# Patient Record
Sex: Female | Born: 1970 | Race: White | Hispanic: No | Marital: Married | Smoking: Current every day smoker
Health system: Southern US, Community
[De-identification: ages and names within clinical notes are randomized; demographics above are authoritative.]

## PROBLEM LIST (undated history)

## (undated) DIAGNOSIS — F418 Other specified anxiety disorders: Secondary | ICD-10-CM

## (undated) DIAGNOSIS — M51369 Other intervertebral disc degeneration, lumbar region without mention of lumbar back pain or lower extremity pain: Secondary | ICD-10-CM

## (undated) DIAGNOSIS — M5136 Other intervertebral disc degeneration, lumbar region: Secondary | ICD-10-CM

## (undated) DIAGNOSIS — I10 Essential (primary) hypertension: Secondary | ICD-10-CM

## (undated) DIAGNOSIS — M549 Dorsalgia, unspecified: Secondary | ICD-10-CM

## (undated) DIAGNOSIS — O009 Unspecified ectopic pregnancy without intrauterine pregnancy: Secondary | ICD-10-CM

## (undated) DIAGNOSIS — N809 Endometriosis, unspecified: Secondary | ICD-10-CM

## (undated) DIAGNOSIS — J45909 Unspecified asthma, uncomplicated: Secondary | ICD-10-CM

## (undated) DIAGNOSIS — G8929 Other chronic pain: Secondary | ICD-10-CM

## (undated) HISTORY — PX: OTHER SURGICAL HISTORY: SHX169

## (undated) HISTORY — PX: ECTOPIC PREGNANCY SURGERY: SHX613

## (undated) HISTORY — PX: DILATION AND CURETTAGE OF UTERUS: SHX78

---

## 2006-03-20 ENCOUNTER — Observation Stay (HOSPITAL_COMMUNITY): Admission: EM | Admit: 2006-03-20 | Discharge: 2006-03-21 | Payer: Self-pay | Admitting: Emergency Medicine

## 2006-03-21 ENCOUNTER — Inpatient Hospital Stay (HOSPITAL_COMMUNITY): Admission: EM | Admit: 2006-03-21 | Discharge: 2006-03-24 | Payer: Self-pay | Admitting: Psychiatry

## 2006-03-22 ENCOUNTER — Ambulatory Visit: Payer: Self-pay | Admitting: *Deleted

## 2007-04-26 ENCOUNTER — Emergency Department (HOSPITAL_COMMUNITY): Admission: EM | Admit: 2007-04-26 | Discharge: 2007-04-26 | Payer: Self-pay | Admitting: Emergency Medicine

## 2009-05-16 ENCOUNTER — Emergency Department (HOSPITAL_COMMUNITY): Admission: EM | Admit: 2009-05-16 | Discharge: 2009-05-16 | Payer: Self-pay | Admitting: Emergency Medicine

## 2009-09-19 ENCOUNTER — Emergency Department (HOSPITAL_COMMUNITY): Admission: EM | Admit: 2009-09-19 | Discharge: 2009-09-19 | Payer: Self-pay | Admitting: Emergency Medicine

## 2009-12-18 ENCOUNTER — Emergency Department (HOSPITAL_COMMUNITY): Admission: EM | Admit: 2009-12-18 | Discharge: 2009-12-18 | Payer: Self-pay | Admitting: Emergency Medicine

## 2010-04-06 ENCOUNTER — Encounter: Payer: Self-pay | Admitting: Emergency Medicine

## 2010-04-06 ENCOUNTER — Ambulatory Visit (HOSPITAL_COMMUNITY): Admission: EM | Admit: 2010-04-06 | Discharge: 2010-04-06 | Payer: Self-pay | Admitting: Emergency Medicine

## 2010-04-06 ENCOUNTER — Ambulatory Visit: Payer: Self-pay | Admitting: Diagnostic Radiology

## 2010-04-10 ENCOUNTER — Emergency Department (HOSPITAL_BASED_OUTPATIENT_CLINIC_OR_DEPARTMENT_OTHER): Admission: EM | Admit: 2010-04-10 | Discharge: 2010-04-10 | Payer: Self-pay | Admitting: Emergency Medicine

## 2010-07-10 ENCOUNTER — Ambulatory Visit: Payer: Self-pay | Admitting: Diagnostic Radiology

## 2010-07-10 ENCOUNTER — Emergency Department (HOSPITAL_BASED_OUTPATIENT_CLINIC_OR_DEPARTMENT_OTHER): Admission: EM | Admit: 2010-07-10 | Discharge: 2010-07-10 | Payer: Self-pay | Admitting: Emergency Medicine

## 2010-08-03 ENCOUNTER — Emergency Department (HOSPITAL_BASED_OUTPATIENT_CLINIC_OR_DEPARTMENT_OTHER): Admission: EM | Admit: 2010-08-03 | Discharge: 2010-08-03 | Payer: Self-pay | Admitting: Emergency Medicine

## 2010-08-03 ENCOUNTER — Ambulatory Visit: Payer: Self-pay | Admitting: Interventional Radiology

## 2010-10-04 ENCOUNTER — Emergency Department (HOSPITAL_BASED_OUTPATIENT_CLINIC_OR_DEPARTMENT_OTHER): Admission: EM | Admit: 2010-10-04 | Discharge: 2010-10-04 | Payer: Self-pay | Admitting: Emergency Medicine

## 2011-02-03 LAB — URINALYSIS, ROUTINE W REFLEX MICROSCOPIC
Bilirubin Urine: NEGATIVE
Ketones, ur: NEGATIVE mg/dL
Protein, ur: NEGATIVE mg/dL
Specific Gravity, Urine: 1.006 (ref 1.005–1.030)
Urobilinogen, UA: 0.2 mg/dL (ref 0.0–1.0)
pH: 7 (ref 5.0–8.0)

## 2011-02-03 LAB — PREGNANCY, URINE: Preg Test, Ur: NEGATIVE

## 2011-02-03 LAB — DIFFERENTIAL
Basophils Absolute: 0.4 10*3/uL — ABNORMAL HIGH (ref 0.0–0.1)
Basophils Relative: 2 % — ABNORMAL HIGH (ref 0–1)
Eosinophils Absolute: 0.2 10*3/uL (ref 0.0–0.7)
Neutro Abs: 13.4 10*3/uL — ABNORMAL HIGH (ref 1.7–7.7)
Neutrophils Relative %: 79 % — ABNORMAL HIGH (ref 43–77)

## 2011-02-03 LAB — BASIC METABOLIC PANEL
BUN: 11 mg/dL (ref 6–23)
CO2: 26 mEq/L (ref 19–32)
Calcium: 8.8 mg/dL (ref 8.4–10.5)
Chloride: 108 mEq/L (ref 96–112)
Creatinine, Ser: 0.6 mg/dL (ref 0.4–1.2)
Glucose, Bld: 98 mg/dL (ref 70–99)

## 2011-02-03 LAB — CULTURE, ROUTINE-ABSCESS: Culture: NO GROWTH

## 2011-02-03 LAB — URINE MICROSCOPIC-ADD ON

## 2011-02-03 LAB — CBC
MCHC: 34.2 g/dL (ref 30.0–36.0)
MCV: 90.5 fL (ref 78.0–100.0)
RDW: 13 % (ref 11.5–15.5)

## 2011-02-03 LAB — ANAEROBIC CULTURE

## 2011-02-10 ENCOUNTER — Emergency Department (HOSPITAL_BASED_OUTPATIENT_CLINIC_OR_DEPARTMENT_OTHER)
Admission: EM | Admit: 2011-02-10 | Discharge: 2011-02-10 | Disposition: A | Discharge status: Home / Self Care | Payer: Self-pay | Attending: Emergency Medicine | Admitting: Emergency Medicine

## 2011-02-10 DIAGNOSIS — J45909 Unspecified asthma, uncomplicated: Secondary | ICD-10-CM | POA: Insufficient documentation

## 2011-02-10 DIAGNOSIS — J438 Other emphysema: Secondary | ICD-10-CM | POA: Insufficient documentation

## 2011-02-10 DIAGNOSIS — I10 Essential (primary) hypertension: Secondary | ICD-10-CM | POA: Insufficient documentation

## 2011-02-10 DIAGNOSIS — IMO0002 Reserved for concepts with insufficient information to code with codable children: Secondary | ICD-10-CM | POA: Insufficient documentation

## 2011-04-04 NOTE — Discharge Summary (Signed)
NAMEMARGIE, [name] NO.:  [id number]   MEDICAL RECORD NO.:  [id number]          PATIENT TYPE:  IPS   LOCATION:  0506                          FACILITY:  [facility]   PHYSICIAN:  Anselm Jungling, MD  DATE OF BIRTH:  January 28, 1971   DATE OF ADMISSION:  03/21/2006  DATE OF DISCHARGE:  03/24/2006                                 DISCHARGE SUMMARY   IDENTIFYING DATA AND REASON FOR ADMISSION:  The patient is a 40 year old  married white female who was brought to the emergency department at [facility] after displaying changes in mental status.  There was concern that she  had overdosed.  She reported that she had taken only two Klonopin that day,  but apparently had taken more.  She had been having back pain and spasms.  She had not been sleeping well for several days prior.  Her spouse found her  in a state of agitation and lethargy.  The emergency services were called.  The patient was known to have a history of bipolar disorder, diagnosed  approximately 3 years prior.  She had been taking Zyprexa 20 mg daily for 3  years.  She had recently been having more racing thoughts, especially at  night.  Please refer to the admission note for further details pertaining to  the symptoms, circumstances and history that led to her hospitalization.  She was given an initial Axis I diagnosis of bipolar disorder, rule out  mania, rule out mixed state, and marijuana abuse.   MEDICAL AND LABORATORY:  The patient was medically and physically assessed  by the psychiatric nurse practitioner upon admission.  Prior to this, she  had been evaluated at [facility].  Her primary care Deanna [name] is  [facility] in [place].  She came to [place] with problems of  hypertension and COPD, using an inhaler.  Her non-psychotropic medications  upon admission included atenolol that she had not been taking, and Motrin  p.r.n. pain.   She was ordered an albuterol inhaler on a p.r.n. basis  for asthmatic  symptoms.   The patient was seen briefly in the emergency department due to some dental  symptoms.  She had had a tooth pulled a week prior.  She was transported to  the emergency room and returned with a suggestion of oral Nystatin solution,  and a course of Pen-Vee K, with Daypro for pain.  There were no significant  medical issues during this brief inpatient stay.   HOSPITAL COURSE:  The patient was admitted to the adult inpatient  psychiatric service.  She presented as a well-nourished, well-developed  adult female who was pleasant and cooperative throughout her stay.  She  indicated that she could not explain how or why she took too much medication  prior to admission.  She denied any suicide attempt.   She did not appear to be grossly psychotic or delusional in any way.  She  was fully oriented, and cognitive functions appeared to be intact.  Her mood  was neither depressed nor activated, nor manic nor irritable.   The patient  the discussed the possibility of having a family meeting  involving her husband prior to discharge, but this apparently was impossible  to accomplish due to her husband working in another state.  She did  communicate with her husband however and reported that he had indicated a  willingness to keep her medication secure in the future and dispense then to  her appropriately.   The patient was discharged on the fourth hospital day in good spirits.  She  was absent any suicidal ideation or any acute psychiatric symptoms.   AFTERCARE:  The patient was to follow-up at [facility]  on Apr 01, 2006 in [place], [place].   DISCHARGE MEDICATIONS:  Lexapro 20 mg daily, Zyprexa 20 mg daily, Pen-Vee K  500 mg q.i.d. through 03/29/2006, Daypro 600 mg b.i.d., and Nystatin oral  rinse four times daily.   DISCHARGE DIAGNOSES:  AXIS I: Bipolar disorder, currently euthymic.  AXIS II: Deferred.  AXIS III: A dental problem   AXIS IV: Stressors severe.  AXIS V: Global assessment of functioning on discharge 60.           ______________________________  Anselm Jungling, MD  Electronically Signed     SPB/MEDQ  D:  03/25/2006  T:  03/26/2006  Job:  [id number]

## 2011-04-04 NOTE — Discharge Summary (Signed)
Deanna [name], Deanna [name]              ACCOUNT NO.:  [id number]   MEDICAL RECORD NO.:  [id number]          PATIENT TYPE:  INP   LOCATION:  A217                          [facility]ILITY[facility]  [facility]   PHYSICIAN:  Osvaldo Shipper, MD     DATE OF BIRTH:  [birth year]   DATE OF ADMISSION:  03/20/2006  DATE OF DISCHARGE:  05/05/2007LH                                 DISCHARGE SUMMARY   HISTORY:  Unassigned patient.   DISCHARGE DIAGNOSES:  1.  Drug overdose with benzodiazepines, likely unintentional.  2.  Altered mental status, resolved.  3.  History of depression.  4.  History of hypertension.  5.  History of bipolar.   Please note that this is a transfer summary.  The patient is being  transferred to [facility].   BRIEF HOSPITAL COURSE:  Please review the H&P dictated by Dr. Sherle Poe,  also.  This is a 40 year old Caucasian female who was brought in to the  hospital after she was found drowsy and lethargic.  It was first thought  that she took a lot of Klonopin.  The exact dosage of her medication that  she took was unclear.  On initial assessment by the admitting physician, the  patient was very drowsy and unable to give an adequate history.  Initially,  ER physician tried to send the patient directly to [facility].  However, because of her lethargy and mild hypokalemia, they requested that  the patient be observed in the hospital for 24 hours.  Overnight, the  patient became less and less drowsy.  This morning, she was fully awake when  I evaluated her.  She mentioned to me that she did not take her medications  intentionally.  She denied suicidal ideation.  However, the patient seemed  to be mixing up her words and not giving an accurate and reliable history.  Hence, I am not very sure if this patient did or did not do an intentional  OD.  There is also a social issue because the patient has a handicapped  child at home who requires full-time attention.  Because of  these issues, I  discussed with our ACT representative and told him that I would be unable to  clear this patient for discharge without a psychiatrist assessment.  Her  potassium was replaced.  The patient was found to be mildly bronchitic this  morning for which Combivent was given and she had improvement in her  symptoms.  The plan was discussed with the patient who agreed to be  voluntarily sent over to [facility].  Dr. Milford Cage was  the accepting physician.  On the day of discharge, her vital signs  were stable.  She was saturating 96% on room air.  She did have some wheezes  bilaterally which improved with her Combivent inhaler.  Her antihypertensive  medication was also initiated.   MEDICATIONS ON DISCHARGE:  Will be determined at [facility].      Osvaldo Shipper, MD  Electronically Signed     GK/MEDQ  D:  03/21/2006  T:  03/23/2006  Job:  [id number]

## 2011-04-04 NOTE — H&P (Signed)
Deanna [name], Deanna [name]              ACCOUNT NO.:  [id number]   MEDICAL RECORD NO.:  [id number]        PATIENT TYPE:  BIPS   LOCATION:  506                          FACILITY:  [id number]   PHYSICIAN:  Vic Ripper, P.A.-C.DATE OF BIRTH:  1971-05-15   DATE OF ADMISSION:  03/21/2006  DATE OF DISCHARGE:                         PSYCHIATRIC ADMISSION ASSESSMENT   IDENTIFYING INFORMATION:  This is a 40 year old married white female.  She  was brought to the emergency department at [facility] on May 4 after  displaying a change in mental status.  Apparently there was some concern  that she may have overdosed.  The patient is prescribed Klonopin.  She  stated that she had only taken 2 Klonopin that day.  She was having some  back pain and spasms.  She had not been sleeping well for the past 4 days  prior to doing that and she also felt very tired.  Her spouse found her to  be fairly agitated while also being lethargic from the Klonopin.  He called  EMS.  She was admitted for observation.  The patient is known to have  bipolar.  She was diagnosed approximately 3 years ago.  She has been taking  Zyprexa 20 mg at h.s. for 3 years.  She states recently she has been having  racing thoughts at night.  She is not sleeping well.  She is waking up  hourly.  She was transferred from [facility] to our service late yesterday.  She was given trazodone with success last night.  She states she slept the  best she has in months.  She is quite concerned that she can be discharged  today as she has a 26 year old son who has genetic translocation of  chromosomes 4 and 10 and is also mentally retarded.  Her husband has to  leave before the bus comes to pick him up and she would like to be home to  get him back on the bus in the morning.   PAST PSYCHIATRIC HISTORY:  No prior inpatient treatment.  She was diagnosed  as bipolar 3 years ago.  This was after her mother had died.  She could not  sleep.  Her daughter  was crying all the time, and she has been followed by  her primary care physician in [place].   SOCIAL HISTORY:  She is a high school graduate in 1990.  She is married  once.  She is not employed.  As already stated, she has a 87 year old son  who has MR and a genetic translocation of chromosomes 4 and 10.   FAMILY HISTORY:  She denies.   ALCOHOL AND DRUG ABUSE:  She does 1-1/2 to 2 packs of cigarettes per day.  She states that she smokes marijuana 2-3 times a week when a friend comes  over.  Her urine drug screen was positive for the marijuana.   PAST MEDICAL HISTORY:  Primary care Keeleigh Terris is [facility] in [place].  Her medical problems are hypertension and COPD.  She uses  an inhaler.  Medications:  She is currently prescribed Lexapro 20 mg p.o.  daily, Zyprexa 20 mg p.o. daily, Klonopin 1 mg p.o. daily, and Lotrel and  she is prescribed Atenolol.  Actually they said to restart her Lotrel.   ALLERGIES:  No known drug allergies.   POSITIVE PHYSICAL FINDINGS:  PHYSICAL EXAMINATION:  She is overweight.  Her  vital signs on admission to [facility] showed that she weighed  208 pounds.  She had a blood pressure of 148/93.  Her pulse was 100-110, and  respirations were 20.  She was afebrile at 98.7.  The patient states that  she has taken the Zyprexa with no metabolic sequelae for 3 years.  She did  not gain weight.  She had been this weight for awhile.  The remainder of her  physical examination is well documented and on the chart and not repeated at  this time.   MENTAL STATUS EXAM:  She is alert and oriented x3.  She is casually groomed  and dressed, adequately nourished.  Her speech is not pressured.  Her mood  is appropriate to the situation, somewhat depressed and anxious.  Her affect  is congruent.  Thought processes are clear, organized and goal oriented.  She wants to be discharged so that she can resume care of her son.  Judgment  and  insight are intact.  Concentration and memory are intact.  Intelligence  is at least average.  She denies suicidal or homicidal ideation.  She denies  auditory or visual hallucinations.   ADMISSION DIAGNOSES:  AXIS I:  Bipolar disorder, recently racing thoughts  and decreased sleep, marijuana abuse.  AXIS II:  No diagnosis.  AXIS III:  Hypertension, chronic obstructive pulmonary disease, obesity.  AXIS IV:  Severe.  AXIS V:  Global assessment of function is 30-35.   PLAN:  Plan was to admit for further stabilization, to adjust her  medications as indicated and the patient is requesting discharge.  We will  check with Dr. Katrinka Blazing to see if that will be possible or not.      Vic Ripper, P.A.-C.     MD/MEDQ  D:  03/22/2006  T:  03/22/2006  Job:  [id number]

## 2011-04-04 NOTE — H&P (Signed)
NAMEBERNISHA, Deanna [name]              ACCOUNT NO.:  [id number]   MEDICAL RECORD NO.:  [id number]          PATIENT TYPE:  INP   LOCATION:  A217                          FACILITY:  [facility]   PHYSICIAN:  Margaretmary Dys, M.D.DATE OF BIRTH:  [birth year]   DATE OF ADMISSION:  03/20/2006  DATE OF DISCHARGE:  LH                                HISTORY & PHYSICAL   PRIMARY CARE PHYSICIAN:  Unassigned.   ADMISSION DIAGNOSES:  1.  Drug overdose.  2.  Altered mental status.   CHIEF COMPLAINT:  The patient took a few pills of Klonopin tonight.   HISTORY OF PRESENT ILLNESS:  Deanna [name] is a 40 year old female who was  brought into the emergency room with complaints of overdose.  The patient  provided me some history but was fairly drowsy.  She said she was having  some back pain and spasms and she took only two pills of Klonopin that was  prescribed to another friend of hers.  She does not take Klonopin.  She  reports that she has not slept in four days and has felt very tired.  The  patient was found by her spouse to be fairly agitated and also lethargic.  They subsequently called EMS.  The patient categorically tells me that she  was not trying to commit suicide and that she enjoys life.  Prior to today,  she was doing fairly well with no significant past medical history or  complaints.  Evaluation in the emergency room revealed she was still feeling  lethargic.  Urine toxicology was positive for THC and benzodiazepines.  Due  to her being so lethargic and drowsy, the patient will be admitted for  medical clearance prior to transfer if still indicated on evaluation when  she is more lucid.   REVIEW OF SYSTEMS:  The temporary review of systems is otherwise negative  except as mentioned in the history of the present illness.   PAST MEDICAL HISTORY:  1.  Hypertension.  2.  Bipolar disorder.  3.  Depression.   MEDICATIONS:  1.  Lexapro.  2.  Klonopin.  3.  Paxil.  4.  Atenolol.  5.   Motrin p.r.n.   ALLERGIES:  She has no known drug allergies.   FAMILY HISTORY:  Positive for coronary artery disease and hypertension.  No  history of severe depressive psychosis or suicidal attempt.   SOCIAL HISTORY:  The patient is married, lives with husband, has one child.  Smokes 1-1/2 packs a day.  Denies any illicit drug use even though her THC  was positive in her urine.   PHYSICAL EXAMINATION:  GENERAL:  The patient is conscious but lethargic and  fairly drowsy but easily arousable.  VITAL SIGNS:  Blood pressure 148/93, pulse 105, respirations 20, temperature  97.4.  Oxygen saturation 98% on room air.  HEENT:  Normocephalic, atraumatic.  Oral mucosa was moist with no exudates.  NECK:  Supple, no JVD, no lymphadenopathy.  LUNGS:  Clear.  There is good air entry bilaterally.  HEART:  S1, S2 regular.  No S3, S4 gallops or rubs.  ABDOMEN:  Soft,  nontender, bowel sounds positive.  Obese.  EXTREMITIES:  No edema.  No induration or tenderness.  CENTRAL NERVOUS SYSTEM:  The patient was conscious but lethargic.  She had  no focal neurologic deficits.  Pupils were equal and reactive to light.  Sensation was intact.   LABORATORY DATA/DIAGNOSTIC DATA:  Her urine toxicology screen was positive  for benzodiazepines and THC.   White blood cell count was 9.4.  Hemoglobin was 13.9, hematocrit 40.3,  platelet count was 269,000 with no left shift.  Sodium 133, potassium 3.4,  chloride 103, CO2 23, glucose 103, BUN of 10, creatinine 0.5, calcium was  8.4.  Pregnancy test was negative.  Alcohol level was less than 5.   ASSESSMENT AND PLAN:  This is a 40 year old female who presented to the  emergency room with what appears to be unintentional overdose with Klonopin  and subsequent altered mental status due to sedative effect.  Plan is to  admit her at this time to the floor.  We will put her on neurochecks q.2 h.  and we will replete her potassium.  We will have a sitter with her and   review her mental status tomorrow.  The patient was already seen by the ACT  team in the ER, and the patient will be seen again tomorrow morning . We  will hold off on her medications at this time.      Margaretmary Dys, M.D.  Electronically Signed     AM/MEDQ  D:  03/21/2006  T:  03/21/2006  Job:  [id number]

## 2011-05-27 ENCOUNTER — Emergency Department (HOSPITAL_BASED_OUTPATIENT_CLINIC_OR_DEPARTMENT_OTHER)
Admission: EM | Admit: 2011-05-27 | Discharge: 2011-05-27 | Disposition: A | Discharge status: Home / Self Care | Payer: Self-pay | Attending: Emergency Medicine | Admitting: Emergency Medicine

## 2011-05-27 ENCOUNTER — Emergency Department (INDEPENDENT_AMBULATORY_CARE_PROVIDER_SITE_OTHER): Payer: Self-pay

## 2011-05-27 ENCOUNTER — Encounter: Payer: Self-pay | Admitting: *Deleted

## 2011-05-27 DIAGNOSIS — M47817 Spondylosis without myelopathy or radiculopathy, lumbosacral region: Secondary | ICD-10-CM

## 2011-05-27 DIAGNOSIS — F17200 Nicotine dependence, unspecified, uncomplicated: Secondary | ICD-10-CM | POA: Insufficient documentation

## 2011-05-27 DIAGNOSIS — M545 Low back pain: Secondary | ICD-10-CM

## 2011-05-27 DIAGNOSIS — M549 Dorsalgia, unspecified: Secondary | ICD-10-CM

## 2011-05-27 LAB — URINALYSIS, ROUTINE W REFLEX MICROSCOPIC
Bilirubin Urine: NEGATIVE
Glucose, UA: NEGATIVE mg/dL
Hgb urine dipstick: NEGATIVE
Ketones, ur: NEGATIVE mg/dL
Nitrite: NEGATIVE
Protein, ur: NEGATIVE mg/dL
Specific Gravity, Urine: 1.014 (ref 1.005–1.030)
Urobilinogen, UA: 0.2 mg/dL (ref 0.0–1.0)
pH: 6 (ref 5.0–8.0)

## 2011-05-27 LAB — URINE MICROSCOPIC-ADD ON

## 2011-05-27 LAB — PREGNANCY, URINE: Preg Test, Ur: NEGATIVE

## 2011-05-27 MED ORDER — ONDANSETRON 8 MG PO TBDP
8.0000 mg | ORAL_TABLET | Freq: Once | ORAL | Status: AC
  Administered 2011-05-27: 8 mg via ORAL

## 2011-05-27 MED ORDER — CYCLOBENZAPRINE HCL 10 MG PO TABS: 10.0000 mg | ORAL_TABLET | Freq: Two times a day (BID) | ORAL | Status: AC | PRN

## 2011-05-27 MED ORDER — HYDROMORPHONE HCL 2 MG/ML IJ SOLN
2.0000 mg | Freq: Once | INTRAMUSCULAR | Status: AC
  Administered 2011-05-27: 2 mg via INTRAMUSCULAR

## 2011-05-27 MED ORDER — OXYCODONE-ACETAMINOPHEN 5-325 MG PO TABS: 1.0000 | ORAL_TABLET | Freq: Four times a day (QID) | ORAL | Status: AC | PRN

## 2011-05-27 NOTE — ED Provider Notes (Signed)
History     Chief Complaint  Patient presents with  . Back Pain   Patient is a 39 y.o. female presenting with back pain. The history is provided by the patient.  Back Pain  This is a new problem. The current episode started more than 2 days ago. The problem occurs constantly. The problem has been gradually worsening. The pain is associated with no known injury. The pain is present in the lumbar spine (coccyx). The quality of the pain is described as stabbing and shooting. The pain does not radiate. The pain is at a severity of 10/10. The pain is severe. The symptoms are aggravated by certain positions. The pain is the same all the time. Pertinent negatives include no chest pain, no fever, no numbness, no abdominal pain, no bowel incontinence, no bladder incontinence, no dysuria, no leg pain, no paresthesias and no weakness. Associated symptoms comments: Urinary urgency. She has tried NSAIDs for the symptoms. The treatment provided no relief.    Past Medical History  Diagnosis Date  . Degenerative disc disease   . Degenerative disc disease   . Degenerative disc disease     Past Surgical History  Procedure Date  . Ectopic pregnancy surgery     No family history on file.  History  Substance Use Topics  . Smoking status: Current Everyday Smoker -- 1.0 packs/day    Types: Cigarettes  . Smokeless tobacco: Not on file  . Alcohol Use: No    OB History    Grav Para Term Preterm Abortions TAB SAB Ect Mult Living                  Review of Systems  Constitutional: Negative for fever.  Cardiovascular: Negative for chest pain.  Gastrointestinal: Negative for abdominal pain and bowel incontinence.  Genitourinary: Negative for bladder incontinence and dysuria.  Musculoskeletal: Positive for back pain.  Neurological: Negative for weakness, numbness and paresthesias.  All other systems reviewed and are negative.    Physical Exam  BP 145/86  Pulse 88  Temp(Src) 97.9 F (36.6 C)  (Oral)  Resp 20  Ht 5\' 1"  (1.549 m)  Wt 165 lb (74.844 kg)  BMI 31.18 kg/m2  SpO2 99%  LMP 05/06/2011  Physical Exam  Constitutional: She is oriented to person, place, and time. She appears well-developed and well-nourished. She appears distressed.  HENT:  Head: Normocephalic and atraumatic.  Eyes: Pupils are equal, round, and reactive to light.  Cardiovascular: Normal rate, regular rhythm and normal heart sounds.   Pulmonary/Chest: Effort normal and breath sounds normal. She has no wheezes. She has no rales.  Abdominal: Soft. She exhibits no distension. There is no tenderness.  Musculoskeletal:       Lumbar back: She exhibits tenderness and bony tenderness. She exhibits no swelling, no edema and no deformity.       Back:  Neurological: She is alert and oriented to person, place, and time. She has normal strength. No sensory deficit.  Skin: Skin is warm and dry.    ED Course  Procedures  MDM Pt with c/o of back pain in the lower lumbar and sacral area.  Seems to be worse with sitting and pressure in the coccyx.  Pt denies any trauma and no signs of infection.  No signs of pilonidal cyst or abscess and neg bedside U/S. Pt denies urinary or bowel incontinence and pt states maybe mild urgency but denies flank pain or fever.  Normal exam except for sacram and coccyx pain.  Will get plain films to eval and give pain control.  UA contaminated but o/w no signs of overt infection. Plain films neg and on re-eval pt feels much better.  Will d/c home.     Gwyneth Sprout, MD 05/27/11 1920

## 2011-05-27 NOTE — ED Notes (Signed)
Report given to Scott Bennett, RN 

## 2011-05-27 NOTE — ED Notes (Signed)
Pt in with c/o lower back pain radiating to both legs x 2-3 days. Reports repetitive lifting at home. Pain 10/10, reports occasional numbness and tingling in lower extremities. MAEW. Pt able to bear weight.

## 2011-05-27 NOTE — ED Notes (Signed)
Pt. Reports she is having lower back pain and frequent urination.  Pt. Is reporting pain in the bottom of the spine.

## 2012-04-26 ENCOUNTER — Emergency Department (HOSPITAL_COMMUNITY)
Admission: EM | Admit: 2012-04-26 | Discharge: 2012-04-26 | Disposition: A | Discharge status: Home / Self Care | Payer: Self-pay | Attending: Emergency Medicine | Admitting: Emergency Medicine

## 2012-04-26 ENCOUNTER — Encounter (HOSPITAL_COMMUNITY): Payer: Self-pay | Admitting: Emergency Medicine

## 2012-04-26 DIAGNOSIS — F17200 Nicotine dependence, unspecified, uncomplicated: Secondary | ICD-10-CM | POA: Insufficient documentation

## 2012-04-26 DIAGNOSIS — M549 Dorsalgia, unspecified: Secondary | ICD-10-CM

## 2012-04-26 DIAGNOSIS — IMO0002 Reserved for concepts with insufficient information to code with codable children: Secondary | ICD-10-CM | POA: Insufficient documentation

## 2012-04-26 LAB — URINALYSIS, ROUTINE W REFLEX MICROSCOPIC
Glucose, UA: NEGATIVE mg/dL
Ketones, ur: NEGATIVE mg/dL
Leukocytes, UA: NEGATIVE
pH: 7 (ref 5.0–8.0)

## 2012-04-26 LAB — POCT PREGNANCY, URINE: Preg Test, Ur: NEGATIVE

## 2012-04-26 MED ORDER — OXYCODONE-ACETAMINOPHEN 5-325 MG PO TABS: 1.0000 | ORAL_TABLET | ORAL | Status: AC | PRN

## 2012-04-26 MED ORDER — KETOROLAC TROMETHAMINE 60 MG/2ML IM SOLN
60.0000 mg | Freq: Once | INTRAMUSCULAR | Status: AC
  Administered 2012-04-26: 60 mg via INTRAMUSCULAR
  Filled 2012-04-26: qty 2

## 2012-04-26 MED ORDER — IBUPROFEN 800 MG PO TABS: 800.0000 mg | ORAL_TABLET | Freq: Three times a day (TID) | ORAL | Status: AC

## 2012-04-26 MED ORDER — OXYCODONE-ACETAMINOPHEN 5-325 MG PO TABS
2.0000 | ORAL_TABLET | Freq: Once | ORAL | Status: AC
  Administered 2012-04-26: 2 via ORAL
  Filled 2012-04-26: qty 2

## 2012-04-26 NOTE — ED Notes (Signed)
Back pain since yesterday no new injury

## 2012-04-26 NOTE — Discharge Instructions (Signed)

## 2012-04-26 NOTE — ED Provider Notes (Signed)
History    This chart was scribed for Joya Gaskins, MD, MD by Smitty Pluck. The patient was seen in room STRE7 and the patient's care was started at 11:10AM.   CSN: [id number]  Arrival date & time 04/26/12  1013   First MD Initiated Contact with Patient 04/26/12 1108      Chief Complaint  Patient presents with  . Back Pain     Patient is a 41 y.o. female presenting with back pain. The history is provided by the patient.  Back Pain  Pertinent negatives include no fever and no dysuria.   Deanna [name] is a 41 y.o. female who presents to the Emergency Department complaining of moderate lower back pain onset 1 day ago. Pt reports that it radiates to right leg and hips. Pt has hx of back pain for past 10 years. Symptoms have been constant. . Pt denies dysuria. She reports increased urinary frequency. Denies any new weakness in her legs. Denies any new injury. No urinary/fecal incontinence H/o back pain with intermittent episodes, similar to prior  Past Medical History  Diagnosis Date  . Degenerative disc disease   . Degenerative disc disease   . Degenerative disc disease     Past Surgical History  Procedure Date  . Ectopic pregnancy surgery     No family history on file.  History  Substance Use Topics  . Smoking status: Current Everyday Smoker -- 1.0 packs/day    Types: Cigarettes  . Smokeless tobacco: Not on file  . Alcohol Use: No    OB History    Grav Para Term Preterm Abortions TAB SAB Ect Mult Living                  Review of Systems  Constitutional: Negative for fever.  Respiratory: Negative for cough and shortness of breath.   Gastrointestinal: Negative for vomiting and diarrhea.  Genitourinary: Positive for urgency. Negative for dysuria and difficulty urinating.  Musculoskeletal: Positive for back pain.    Allergies  Erythromycin base and Latex  Home Medications   Current Outpatient Rx  Name Route Sig Dispense Refill  . BC HEADACHE POWDER  PO Oral Take 1 packet by mouth daily as needed. For pain    . DIPHENHYDRAMINE HCL 25 MG PO TABS Oral Take 25-75 mg by mouth 2 (two) times daily as needed. For allergies     . IBUPROFEN 200 MG PO TABS Oral Take 800 mg by mouth every 6 (six) hours as needed. As needed       BP 178/111  Pulse 107  Temp 98.8 F (37.1 C)  Resp 20  SpO2 99%  Physical Exam CONSTITUTIONAL: Well developed/well nourished HEAD AND FACE: Normocephalic/atraumatic EYES: EOMI/PERRL ENMT: Mucous membranes moist NECK: supple no meningeal signs SPINE:, lumbar tenderness, no other spinal tenderness, No bruising/crepitance/stepoffs noted to spine CV: S1/S2 noted, no murmurs/rubs/gallops noted LUNGS: Lungs are clear to auscultation bilaterally, no apparent distress ABDOMEN: soft, nontender, no rebound or guarding GU:no cva tenderness NEURO: Awake/alert, equal distal motor: hip flexion/knee flexion/extension, ankle dorsi/plantar flexion, great toe extension intact bilaterally, no apparent sensory deficit in any dermatome.  Equal patellar.  Pt is able to ambulate. EXTREMITIES: pulses normal, full ROM SKIN: warm, color normal PSYCH: no abnormalities of mood noted  ED Course  Procedures  DIAGNOSTIC STUDIES: Oxygen Saturation is 99% on room air, normal by my interpretation.    COORDINATION OF CARE: 11:16AM EDP orders medication: percocet 325 mg, Toradol 60 mg  The patient appears reasonably screened and/or stabilized for discharge and I doubt any other medical condition or other [facility] requiring further screening, evaluation, or treatment in the ED at this time prior to discharge.  MDM  Nursing notes including past medical history, social history and family history reviewed and considered in documentation Previous records reviewed and considered - previous xray results noted All labs/vitals reviewed and considered   I personally performed the services described in this documentation, which was scribed in my  presence. The recorded information has been reviewed and considered.        [id number]  Joya Gaskins, MD 04/26/12 [phone]

## 2012-10-18 ENCOUNTER — Emergency Department (HOSPITAL_BASED_OUTPATIENT_CLINIC_OR_DEPARTMENT_OTHER)
Admission: EM | Admit: 2012-10-18 | Discharge: 2012-10-18 | Disposition: A | Discharge status: Home / Self Care | Payer: Self-pay | Attending: Emergency Medicine | Admitting: Emergency Medicine

## 2012-10-18 ENCOUNTER — Encounter (HOSPITAL_BASED_OUTPATIENT_CLINIC_OR_DEPARTMENT_OTHER): Payer: Self-pay | Admitting: *Deleted

## 2012-10-18 DIAGNOSIS — IMO0002 Reserved for concepts with insufficient information to code with codable children: Secondary | ICD-10-CM | POA: Insufficient documentation

## 2012-10-18 DIAGNOSIS — F17200 Nicotine dependence, unspecified, uncomplicated: Secondary | ICD-10-CM | POA: Insufficient documentation

## 2012-10-18 DIAGNOSIS — K029 Dental caries, unspecified: Secondary | ICD-10-CM | POA: Insufficient documentation

## 2012-10-18 DIAGNOSIS — K0520 Aggressive periodontitis, unspecified: Secondary | ICD-10-CM | POA: Insufficient documentation

## 2012-10-18 DIAGNOSIS — R220 Localized swelling, mass and lump, head: Secondary | ICD-10-CM | POA: Insufficient documentation

## 2012-10-18 DIAGNOSIS — R221 Localized swelling, mass and lump, neck: Secondary | ICD-10-CM | POA: Insufficient documentation

## 2012-10-18 DIAGNOSIS — K089 Disorder of teeth and supporting structures, unspecified: Secondary | ICD-10-CM | POA: Insufficient documentation

## 2012-10-18 DIAGNOSIS — I10 Essential (primary) hypertension: Secondary | ICD-10-CM | POA: Insufficient documentation

## 2012-10-18 DIAGNOSIS — K0889 Other specified disorders of teeth and supporting structures: Secondary | ICD-10-CM

## 2012-10-18 HISTORY — DX: Essential (primary) hypertension: I10

## 2012-10-18 MED ORDER — PENICILLIN V POTASSIUM 500 MG PO TABS: 500.0000 mg | ORAL_TABLET | Freq: Four times a day (QID) | ORAL | Status: AC

## 2012-10-18 MED ORDER — HYDROCODONE-ACETAMINOPHEN 5-325 MG PO TABS: 2.0000 | ORAL_TABLET | ORAL | Status: DC | PRN

## 2012-10-18 NOTE — ED Provider Notes (Signed)
History     CSN: [id number]  Arrival date & time 10/18/12  1400   First MD Initiated Contact with Patient 10/18/12 1412      Chief Complaint  Patient presents with  . Dental Pain    (Consider location/radiation/quality/duration/timing/severity/associated sxs/prior treatment) Patient is a 41 y.o. female presenting with tooth pain. The history is provided by the patient. No language interpreter was used.  Dental PainThe primary symptoms include mouth pain. The symptoms began 2 days ago. The symptoms are worsening. The symptoms are recurrent.  Additional symptoms include: gum swelling and gum tenderness. Medical issues include: periodontal disease.   Pt has a painful upper tooth.  Pt complains of swelling and pain Past Medical History  Diagnosis Date  . Degenerative disc disease   . Degenerative disc disease   . Degenerative disc disease   . Hypertension     Past Surgical History  Procedure Date  . Ectopic pregnancy surgery     History reviewed. No pertinent family history.  History  Substance Use Topics  . Smoking status: Current Every Day Smoker -- 0.5 packs/day    Types: Cigarettes  . Smokeless tobacco: Not on file  . Alcohol Use: No    OB History    Grav Para Term Preterm Abortions TAB SAB Ect Mult Living                  Review of Systems  HENT: Positive for dental problem.   All other systems reviewed and are negative.    Allergies  Erythromycin base and Latex  Home Medications   Current Outpatient Rx  Name  Route  Sig  Dispense  Refill  . BC HEADACHE POWDER PO   Oral   Take 1 packet by mouth daily as needed. For pain         . DIPHENHYDRAMINE HCL 25 MG PO TABS   Oral   Take 25-75 mg by mouth 2 (two) times daily as needed. For allergies          . IBUPROFEN 200 MG PO TABS   Oral   Take 800 mg by mouth every 6 (six) hours as needed. As needed            BP 173/99  Pulse 100  Temp 98.1 F (36.7 C) (Oral)  Resp 18  Ht 5\' 1"  (1.549  m)  Wt 170 lb (77.111 kg)  BMI 32.12 kg/m2  SpO2 98%  LMP 10/17/2012  Physical Exam  Nursing note and vitals reviewed. Constitutional: She appears well-developed and well-nourished.  HENT:  Head: Normocephalic.       Upper single central incisor swollen gum,  Tender to touch  Eyes: Pupils are equal, round, and reactive to light.  Cardiovascular: Normal rate.   Pulmonary/Chest: Effort normal.  Neurological: She is alert.  Skin: Skin is warm.    ED Course  Procedures (including critical care time)  Labs Reviewed - No data to display No results found.   1. Toothache       MDM  Pcn, hydrocodone        Lonia Skinner [place], [place] 10/18/12 1432  Lonia Skinner [place], [place] 10/18/12 1434

## 2012-10-18 NOTE — ED Notes (Signed)
Pt c/o dental pain x 2 days.  

## 2012-10-22 NOTE — ED Provider Notes (Signed)
Medical screening examination/treatment/procedure(s) were performed by non-physician practitioner and as supervising physician I was immediately available for consultation/collaboration.  Ansley Mangiapane M Teiana Hajduk, MD 10/22/12 0201 

## 2013-04-12 ENCOUNTER — Emergency Department (HOSPITAL_BASED_OUTPATIENT_CLINIC_OR_DEPARTMENT_OTHER)
Admission: EM | Admit: 2013-04-12 | Discharge: 2013-04-12 | Disposition: A | Discharge status: Home / Self Care | Payer: Self-pay | Attending: Emergency Medicine | Admitting: Emergency Medicine

## 2013-04-12 ENCOUNTER — Emergency Department (HOSPITAL_BASED_OUTPATIENT_CLINIC_OR_DEPARTMENT_OTHER): Payer: Self-pay

## 2013-04-12 ENCOUNTER — Encounter (HOSPITAL_BASED_OUTPATIENT_CLINIC_OR_DEPARTMENT_OTHER): Payer: Self-pay | Admitting: *Deleted

## 2013-04-12 DIAGNOSIS — Z7982 Long term (current) use of aspirin: Secondary | ICD-10-CM | POA: Insufficient documentation

## 2013-04-12 DIAGNOSIS — I10 Essential (primary) hypertension: Secondary | ICD-10-CM | POA: Insufficient documentation

## 2013-04-12 DIAGNOSIS — Y9389 Activity, other specified: Secondary | ICD-10-CM | POA: Insufficient documentation

## 2013-04-12 DIAGNOSIS — W1809XA Striking against other object with subsequent fall, initial encounter: Secondary | ICD-10-CM | POA: Insufficient documentation

## 2013-04-12 DIAGNOSIS — F17200 Nicotine dependence, unspecified, uncomplicated: Secondary | ICD-10-CM | POA: Insufficient documentation

## 2013-04-12 DIAGNOSIS — J45909 Unspecified asthma, uncomplicated: Secondary | ICD-10-CM | POA: Insufficient documentation

## 2013-04-12 DIAGNOSIS — S5010XA Contusion of unspecified forearm, initial encounter: Secondary | ICD-10-CM | POA: Insufficient documentation

## 2013-04-12 DIAGNOSIS — Z91040 Latex allergy status: Secondary | ICD-10-CM | POA: Insufficient documentation

## 2013-04-12 DIAGNOSIS — Z8739 Personal history of other diseases of the musculoskeletal system and connective tissue: Secondary | ICD-10-CM | POA: Insufficient documentation

## 2013-04-12 DIAGNOSIS — Z79899 Other long term (current) drug therapy: Secondary | ICD-10-CM | POA: Insufficient documentation

## 2013-04-12 DIAGNOSIS — R209 Unspecified disturbances of skin sensation: Secondary | ICD-10-CM | POA: Insufficient documentation

## 2013-04-12 DIAGNOSIS — Z8659 Personal history of other mental and behavioral disorders: Secondary | ICD-10-CM | POA: Insufficient documentation

## 2013-04-12 DIAGNOSIS — Y929 Unspecified place or not applicable: Secondary | ICD-10-CM | POA: Insufficient documentation

## 2013-04-12 DIAGNOSIS — S5011XA Contusion of right forearm, initial encounter: Secondary | ICD-10-CM

## 2013-04-12 HISTORY — DX: Unspecified asthma, uncomplicated: J45.909

## 2013-04-12 HISTORY — DX: Other specified anxiety disorders: F41.8

## 2013-04-12 MED ORDER — HYDROCODONE-ACETAMINOPHEN 5-325 MG PO TABS: 2.0000 | ORAL_TABLET | ORAL | Status: DC | PRN

## 2013-04-12 MED ORDER — NAPROXEN 500 MG PO TABS: 500.0000 mg | ORAL_TABLET | Freq: Two times a day (BID) | ORAL | Status: DC

## 2013-04-12 MED ORDER — OXYCODONE-ACETAMINOPHEN 5-325 MG PO TABS
2.0000 | ORAL_TABLET | Freq: Once | ORAL | Status: AC
  Administered 2013-04-12: 2 via ORAL
  Filled 2013-04-12 (×2): qty 2

## 2013-04-12 MED ORDER — NAPROXEN 250 MG PO TABS
500.0000 mg | ORAL_TABLET | Freq: Once | ORAL | Status: AC
  Administered 2013-04-12: 500 mg via ORAL
  Filled 2013-04-12: qty 2
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
## 2013-06-29 ENCOUNTER — Emergency Department (HOSPITAL_COMMUNITY)
Admission: EM | Admit: 2013-06-29 | Discharge: 2013-06-29 | Disposition: A | Discharge status: Home / Self Care | Payer: Self-pay | Attending: Emergency Medicine | Admitting: Emergency Medicine

## 2013-06-29 ENCOUNTER — Emergency Department (HOSPITAL_COMMUNITY): Payer: Self-pay

## 2013-06-29 ENCOUNTER — Encounter (HOSPITAL_COMMUNITY): Payer: Self-pay | Admitting: Emergency Medicine

## 2013-06-29 DIAGNOSIS — Z8659 Personal history of other mental and behavioral disorders: Secondary | ICD-10-CM | POA: Insufficient documentation

## 2013-06-29 DIAGNOSIS — R110 Nausea: Secondary | ICD-10-CM | POA: Insufficient documentation

## 2013-06-29 DIAGNOSIS — Z3202 Encounter for pregnancy test, result negative: Secondary | ICD-10-CM | POA: Insufficient documentation

## 2013-06-29 DIAGNOSIS — R109 Unspecified abdominal pain: Secondary | ICD-10-CM

## 2013-06-29 DIAGNOSIS — N898 Other specified noninflammatory disorders of vagina: Secondary | ICD-10-CM | POA: Insufficient documentation

## 2013-06-29 DIAGNOSIS — M51379 Other intervertebral disc degeneration, lumbosacral region without mention of lumbar back pain or lower extremity pain: Secondary | ICD-10-CM | POA: Insufficient documentation

## 2013-06-29 DIAGNOSIS — Z7982 Long term (current) use of aspirin: Secondary | ICD-10-CM | POA: Insufficient documentation

## 2013-06-29 DIAGNOSIS — I10 Essential (primary) hypertension: Secondary | ICD-10-CM | POA: Insufficient documentation

## 2013-06-29 DIAGNOSIS — F17200 Nicotine dependence, unspecified, uncomplicated: Secondary | ICD-10-CM | POA: Insufficient documentation

## 2013-06-29 DIAGNOSIS — N760 Acute vaginitis: Secondary | ICD-10-CM

## 2013-06-29 DIAGNOSIS — M5137 Other intervertebral disc degeneration, lumbosacral region: Secondary | ICD-10-CM | POA: Insufficient documentation

## 2013-06-29 DIAGNOSIS — Z91040 Latex allergy status: Secondary | ICD-10-CM | POA: Insufficient documentation

## 2013-06-29 DIAGNOSIS — G8929 Other chronic pain: Secondary | ICD-10-CM | POA: Insufficient documentation

## 2013-06-29 DIAGNOSIS — R197 Diarrhea, unspecified: Secondary | ICD-10-CM | POA: Insufficient documentation

## 2013-06-29 DIAGNOSIS — B9689 Other specified bacterial agents as the cause of diseases classified elsewhere: Secondary | ICD-10-CM

## 2013-06-29 DIAGNOSIS — Z8742 Personal history of other diseases of the female genital tract: Secondary | ICD-10-CM | POA: Insufficient documentation

## 2013-06-29 DIAGNOSIS — J45909 Unspecified asthma, uncomplicated: Secondary | ICD-10-CM | POA: Insufficient documentation

## 2013-06-29 HISTORY — DX: Dorsalgia, unspecified: M54.9

## 2013-06-29 HISTORY — DX: Endometriosis, unspecified: N80.9

## 2013-06-29 HISTORY — DX: Other intervertebral disc degeneration, lumbar region: M51.36

## 2013-06-29 HISTORY — DX: Other chronic pain: G89.29

## 2013-06-29 HISTORY — DX: Unspecified ectopic pregnancy without intrauterine pregnancy: O00.90

## 2013-06-29 HISTORY — DX: Other intervertebral disc degeneration, lumbar region without mention of lumbar back pain or lower extremity pain: M51.369

## 2013-06-29 LAB — COMPREHENSIVE METABOLIC PANEL
Albumin: 3.1 g/dL — ABNORMAL LOW (ref 3.5–5.2)
BUN: 14 mg/dL (ref 6–23)
Calcium: 8.8 mg/dL (ref 8.4–10.5)
Creatinine, Ser: 0.51 mg/dL (ref 0.50–1.10)
Potassium: 3.6 mEq/L (ref 3.5–5.1)
Total Protein: 6.8 g/dL (ref 6.0–8.3)

## 2013-06-29 LAB — CBC WITH DIFFERENTIAL/PLATELET
Basophils Absolute: 0 10*3/uL (ref 0.0–0.1)
Eosinophils Relative: 2 % (ref 0–5)
Lymphocytes Relative: 18 % (ref 12–46)
Neutro Abs: 8.1 10*3/uL — ABNORMAL HIGH (ref 1.7–7.7)
Platelets: 318 10*3/uL (ref 150–400)
RDW: 13.8 % (ref 11.5–15.5)
WBC: 10.9 10*3/uL — ABNORMAL HIGH (ref 4.0–10.5)

## 2013-06-29 LAB — WET PREP, GENITAL
Trich, Wet Prep: NONE SEEN
WBC, Wet Prep HPF POC: NONE SEEN
Yeast Wet Prep HPF POC: NONE SEEN

## 2013-06-29 LAB — LIPASE, BLOOD: Lipase: 35 U/L (ref 11–59)

## 2013-06-29 LAB — URINALYSIS, ROUTINE W REFLEX MICROSCOPIC
Leukocytes, UA: NEGATIVE
Protein, ur: NEGATIVE mg/dL
Urobilinogen, UA: 0.2 mg/dL (ref 0.0–1.0)

## 2013-06-29 MED ORDER — DICYCLOMINE HCL 10 MG/ML IM SOLN
20.0000 mg | Freq: Once | INTRAMUSCULAR | Status: AC
  Administered 2013-06-29: 20 mg via INTRAMUSCULAR
  Filled 2013-06-29: qty 2

## 2013-06-29 MED ORDER — ONDANSETRON HCL 4 MG/2ML IJ SOLN
4.0000 mg | INTRAMUSCULAR | Status: DC | PRN
  Administered 2013-06-29: 4 mg via INTRAVENOUS
  Filled 2013-06-29: qty 2

## 2013-06-29 MED ORDER — MORPHINE SULFATE 4 MG/ML IJ SOLN
4.0000 mg | INTRAMUSCULAR | Status: AC | PRN
  Administered 2013-06-29 (×2): 4 mg via INTRAVENOUS
  Filled 2013-06-29 (×2): qty 1

## 2013-06-29 MED ORDER — SODIUM CHLORIDE 0.9 % IV SOLN
INTRAVENOUS | Status: DC
  Administered 2013-06-29: 1000 mL via INTRAVENOUS

## 2013-06-29 MED ORDER — KETOROLAC TROMETHAMINE 30 MG/ML IJ SOLN
30.0000 mg | Freq: Once | INTRAMUSCULAR | Status: AC
  Administered 2013-06-29: 30 mg via INTRAVENOUS
  Filled 2013-06-29: qty 1

## 2013-06-29 MED ORDER — IOHEXOL 300 MG/ML  SOLN
100.0000 mL | Freq: Once | INTRAMUSCULAR | Status: AC | PRN
  Administered 2013-06-29: 100 mL via INTRAVENOUS

## 2013-06-29 MED ORDER — HYDROCODONE-ACETAMINOPHEN 5-325 MG PO TABS: ORAL_TABLET | ORAL | Status: DC

## 2013-06-29 MED ORDER — IOHEXOL 300 MG/ML  SOLN: 50.0000 mL | Freq: Once | INTRAMUSCULAR | Status: DC | PRN

## 2013-06-29 MED ORDER — METRONIDAZOLE 500 MG PO TABS: 500.0000 mg | ORAL_TABLET | Freq: Two times a day (BID) | ORAL | Status: DC

## 2013-06-29 NOTE — ED Notes (Addendum)
Patient with no complaints at this time. Respirations even and unlabored. Skin warm/dry. Discharge instructions reviewed with patient at this time. Patient given opportunity to voice concerns/ask questions. IV removed per policy and band-aid applied to site. Patient discharged at this time and left Emergency Department via wheelchair.  

## 2013-06-29 NOTE — ED Notes (Signed)
Ct tech in room to get patient. Patient sitting up in bed smoking cigarette. Patient instructed that she could not smoke in or on hospital property. Patient verbalized understanding. Patient's cigarettes and lighter confiscated by security. Dr Clarene Duke made aware.

## 2013-06-29 NOTE — ED Notes (Signed)
Family member out to desk asking for pain medication for said PT.  Spoke with pt, explained was given pain medication 1 hr ago. Pt verbalized understanding. Comfort measures provided.

## 2013-06-29 NOTE — ED Notes (Signed)
Pt's family member out to desk asking a time frame as he needs to leave and daughter wants to have more pain medication.

## 2013-06-29 NOTE — Progress Notes (Signed)
ED/CM noted pt did not have health insurance, and/or PCP. Patient was given the ED [place] uninsured handout with information for the clinics, food pantries, and the handout for insurance sign-up. Patient expressed appreciation for this.      

## 2013-06-29 NOTE — ED Provider Notes (Signed)
CSN: [id number]     Arrival date & time 06/29/13  1015 History     First MD Initiated Contact with Patient 06/29/13 1019     Chief Complaint  Patient presents with  . Abdominal Pain   HPI Pt was seen at 1025.  Per pt, c/o gradual onset and persistence of constant generalized abd "pain" for the past 2 weeks, worse over the past several days.  Has been associated with multiple intermittent episodes of diarrhea and nausea, as well as vaginal discharge.  Describes the abd pain as "cramping."  Denies vomiting, no fevers, no flank/back pain, no rash, no CP/SOB, no black or blood in stools, no hematuria/dysuria.      Past Medical History  Diagnosis Date  . Hypertension   . Asthma   . Depression with anxiety   . Chronic back pain   . DDD (degenerative disc disease), lumbar   . Endometriosis   . Ectopic pregnancy    Past Surgical History  Procedure Laterality Date  . Ectopic pregnancy surgery    . Peri anal cyst    . Ectopic pregnancy surgery    . Dilation and curettage of uterus      History  Substance Use Topics  . Smoking status: Current Every Day Smoker -- 0.50 packs/day    Types: Cigarettes  . Smokeless tobacco: Not on file  . Alcohol Use: No    Review of Systems ROS: Statement: All systems negative except as marked or noted in the HPI; Constitutional: Negative for fever and chills. ; ; Eyes: Negative for eye pain, redness and discharge. ; ; ENMT: Negative for ear pain, hoarseness, nasal congestion, sinus pressure and sore throat. ; ; Cardiovascular: Negative for chest pain, palpitations, diaphoresis, dyspnea and peripheral edema. ; ; Respiratory: Negative for cough, wheezing and stridor. ; ; Gastrointestinal: +abd pain, nausea, diarrhea. Negative for vomiting, blood in stool, hematemesis, jaundice and rectal bleeding. ; ; Genitourinary: Negative for dysuria, flank pain and hematuria. ; ; GYN:  No vaginal bleeding, +vaginal discharge, no vulvar pain.;; Musculoskeletal: Negative  for back pain and neck pain. Negative for swelling and trauma.; ; Skin: Negative for pruritus, rash, abrasions, blisters, bruising and skin lesion.; ; Neuro: Negative for headache, lightheadedness and neck stiffness. Negative for weakness, altered level of consciousness , altered mental status, extremity weakness, paresthesias, involuntary movement, seizure and syncope.     Allergies  Erythromycin base and Latex  Home Medications   Current Outpatient Rx  Name  Route  Sig  Dispense  Refill  . Aspirin-Salicylamide-Caffeine (BC HEADACHE POWDER PO)   Oral   Take 1 packet by mouth daily as needed. For pain         . diphenhydrAMINE (BENADRYL) 25 MG tablet   Oral   Take 25-75 mg by mouth 2 (two) times daily as needed. For allergies          . HYDROcodone-acetaminophen (NORCO/VICODIN) 5-325 MG per tablet   Oral   Take 2 tablets by mouth every 4 (four) hours as needed for pain.   20 tablet   0   . HYDROcodone-acetaminophen (NORCO/VICODIN) 5-325 MG per tablet   Oral   Take 2 tablets by mouth every 4 (four) hours as needed for pain.   10 tablet   0   . ibuprofen (ADVIL,MOTRIN) 200 MG tablet   Oral   Take 800 mg by mouth every 6 (six) hours as needed. As needed          . naproxen (NAPROSYN)  500 MG tablet   Oral   Take 1 tablet (500 mg total) by mouth 2 (two) times daily with a meal.   30 tablet   0    BP 154/84  Pulse 112  Temp(Src) 98.5 F (36.9 C) (Oral)  Resp 20  Ht 5\' 2"  (1.575 m)  Wt 160 lb (72.576 kg)  BMI 29.26 kg/m2  SpO2 96%  LMP 06/15/2013 Physical Exam 1030: Physical examination:  Nursing notes reviewed; Vital signs and O2 SAT reviewed;  Constitutional: Well developed, Well nourished, Well hydrated, Uncomfortable appearing.; Head:  Normocephalic, atraumatic; Eyes: EOMI, PERRL, No scleral icterus; ENMT: Mouth and pharynx normal, Mucous membranes moist; Neck: Supple, Full range of motion, No lymphadenopathy; Cardiovascular: Regular rate and rhythm, No murmur,  rub, or gallop; Respiratory: Breath sounds clear & equal bilaterally, No rales, rhonchi, wheezes.  Speaking full sentences with ease, Normal respiratory effort/excursion; Chest: Nontender, Movement normal; Abdomen: Soft, +diffuse tenderness to palp. No rebound or guarding. Nondistended, Normal bowel sounds; Genitourinary: No CVA tenderness. Pelvic exam performed with permission of pt and female ED tech assist during exam.  External genitalia w/o lesions. Vaginal vault without discharge.  Cervix w/o lesions, not friable, GC/chlam and wet prep obtained and sent to lab.  Bimanual exam w/o CMT or uterine tenderness. +bilat adnexal tenderness.;;; Extremities: Pulses normal, No tenderness, No edema, No calf edema or asymmetry.; Neuro: AA&Ox3, Major CN grossly intact.  Speech clear. Climbs on and off stretcher easily by herself. Gait upright and steady. No gross focal motor or sensory deficits in extremities.; Skin: Color normal, Warm, Dry.   ED Course   Procedures    MDM  MDM Reviewed: previous chart, nursing note and vitals Interpretation: labs, CT scan and ECG    Date: 06/29/2013  Rate: 94  Rhythm: normal sinus rhythm and premature atrial contractions (PAC)  QRS Axis: normal  Intervals: normal  ST/T Wave abnormalities: normal  Conduction Disutrbances:none  Narrative Interpretation:   Old EKG Reviewed: none available.   Results for orders placed during the hospital encounter of 06/29/13  WET PREP, GENITAL      Result Value Range   Yeast Wet Prep HPF POC NONE SEEN  NONE SEEN   Trich, Wet Prep NONE SEEN  NONE SEEN   Clue Cells Wet Prep HPF POC FEW (*) NONE SEEN   WBC, Wet Prep HPF POC NONE SEEN  NONE SEEN  PREGNANCY, URINE      Result Value Range   Preg Test, Ur NEGATIVE  NEGATIVE  URINALYSIS, ROUTINE W REFLEX MICROSCOPIC      Result Value Range   Color, Urine YELLOW  YELLOW   APPearance CLEAR  CLEAR   Specific Gravity, Urine >1.030 (*) 1.005 - 1.030   pH 5.5  5.0 - 8.0   Glucose,  UA NEGATIVE  NEGATIVE mg/dL   Hgb urine dipstick NEGATIVE  NEGATIVE   Bilirubin Urine NEGATIVE  NEGATIVE   Ketones, ur NEGATIVE  NEGATIVE mg/dL   Protein, ur NEGATIVE  NEGATIVE mg/dL   Urobilinogen, UA 0.2  0.0 - 1.0 mg/dL   Nitrite NEGATIVE  NEGATIVE   Leukocytes, UA NEGATIVE  NEGATIVE  CBC WITH DIFFERENTIAL      Result Value Range   WBC 10.9 (*) 4.0 - 10.5 K/uL   RBC 4.24  3.87 - 5.11 MIL/uL   Hemoglobin 12.9  12.0 - 15.0 g/dL   HCT 16.1  09.6 - 04.5 %   MCV 88.7  78.0 - 100.0 fL   MCH 30.4  26.0 -  34.0 pg   MCHC 34.3  30.0 - 36.0 g/dL   RDW 16.1  09.6 - 04.5 %   Platelets 318  150 - 400 K/uL   Neutrophils Relative % 74  43 - 77 %   Neutro Abs 8.1 (*) 1.7 - 7.7 K/uL   Lymphocytes Relative 18  12 - 46 %   Lymphs Abs 1.9  0.7 - 4.0 K/uL   Monocytes Relative 6  3 - 12 %   Monocytes Absolute 0.6  0.1 - 1.0 K/uL   Eosinophils Relative 2  0 - 5 %   Eosinophils Absolute 0.3  0.0 - 0.7 K/uL   Basophils Relative 0  0 - 1 %   Basophils Absolute 0.0  0.0 - 0.1 K/uL  COMPREHENSIVE METABOLIC PANEL      Result Value Range   Sodium 138  135 - 145 mEq/L   Potassium 3.6  3.5 - 5.1 mEq/L   Chloride 105  96 - 112 mEq/L   CO2 23  19 - 32 mEq/L   Glucose, Bld 126 (*) 70 - 99 mg/dL   BUN 14  6 - 23 mg/dL   Creatinine, Ser 4.09  0.50 - 1.10 mg/dL   Calcium 8.8  8.4 - 81.1 mg/dL   Total Protein 6.8  6.0 - 8.3 g/dL   Albumin 3.1 (*) 3.5 - 5.2 g/dL   AST 10  0 - 37 U/L   ALT 13  0 - 35 U/L   Alkaline Phosphatase 99  39 - 117 U/L   Total Bilirubin <0.1 (*) 0.3 - 1.2 mg/dL   GFR calc non Af Amer >90  >90 mL/min   GFR calc Af Amer >90  >90 mL/min  LIPASE, BLOOD      Result Value Range   Lipase 35  11 - 59 U/L  HCG, SERUM, QUALITATIVE      Result Value Range   Preg, Serum NEGATIVE  NEGATIVE   Ct Abdomen Pelvis W Contrast 06/29/2013   *RADIOLOGY REPORT*  Clinical Data: Pain  CT ABDOMEN AND PELVIS WITH CONTRAST  Technique:  Multidetector CT imaging of the abdomen and pelvis was performed  following the standard protocol during bolus administration of intravenous contrast.  Contrast: OMNIPAQUE IOHEXOL 300 MG/ML  SOLN  Comparison: 04/06/2010  Findings: The lung bases are free of acute infiltrate or sizable effusion.  The liver, gallbladder, spleen, adrenal glands and pancreas are all normal in their CT appearance.  The kidneys are well visualized bilaterally and reveal no renal calculi or obstructive changes. Delayed images demonstrate adequate excretion of contrast material.  The appendix is not visualized and may have been surgically removed.  The bladder is well distended without opacified urine. No pelvic mass lesion or side wall abnormality is seen.  The previously noted perianal abscess has resolved in the interval.  No other focal abnormality is seen.  IMPRESSION: No acute abnormality noted.   Original Report Authenticated By: Alcide Clever, M.D.   [place] Art/ven Flow Abd Pelv Doppler 06/29/2013   *RADIOLOGY REPORT*  Clinical Data:  Pelvic pain.  Rule out torsion.  LMP 06/15/2013.  TRANSABDOMINAL AND TRANSVAGINAL ULTRASOUND OF PELVIS DOPPLER ULTRASOUND OF OVARIES  Technique:  Both transabdominal and transvaginal ultrasound examinations of the pelvis were performed. Transabdominal technique was performed for global imaging of the pelvis including uterus, ovaries, adnexal regions, and pelvic cul-de-sac.  It was necessary to proceed with endovaginal exam following the transabdominal exam to visualize the uterus, endometrium.  Color and duplex Doppler  ultrasound was utilized to evaluate blood flow to the ovaries.  Comparison:  CT of the abdomen and pelvis on 06/29/2013  FINDINGS  Uterus:  6.8 x 4.3 x 4.9 cm.  Nabothian cyst incidentally noted.  Endometrium:  Normal in appearance, 0.6 cm in thickness.  Right ovary: Normal in appearance, 3.9 x 3.0 x 2.4 cm.  Left ovary: 2.6 x 1.8 x 1.3 cm.  Normal in appearance.  Pulsed Doppler evaluation demonstrates normal low-resistance arterial and venous  waveforms in both ovaries.  IMPRESSION:  1.Normal exam.  No evidence of pelvic mass or other significant abnormality. 2.No sonographic evidence for ovarian torsion.   Original Report Authenticated By: Norva Pavlov, M.D.     1425:  Pt has tol PO well while in the ED without N/V.  No stooling while in the ED.  Abd benign, VSS. More comfortable after meds and wants to go home now. Will tx for BV.  Dx and testing d/w pt and family.  Questions answered.  Verb understanding, agreeable to d/c home with outpt f/u.         Laray Anger, DO 07/02/13 2204

## 2013-06-29 NOTE — ED Notes (Signed)
Pt c/o lower abd pain x 2 weeks and abnormal vaginal discharge.  Pt says feels like "bladder is burning."   Denies burning with urination.  Reports has had intermittent diarrhea and nausea.  No vomiting.  LBM was yesterday.  LMP was 2 weeks ago.

## 2013-06-29 NOTE — ED Notes (Signed)
Patient reports chest pain with shortness of breath and palpitations. Dr Clarene Duke made aware. EKG done.

## 2013-07-02 ENCOUNTER — Emergency Department (HOSPITAL_BASED_OUTPATIENT_CLINIC_OR_DEPARTMENT_OTHER)
Admission: EM | Admit: 2013-07-02 | Discharge: 2013-07-02 | Disposition: A | Discharge status: Home / Self Care | Payer: Self-pay | Attending: Emergency Medicine | Admitting: Emergency Medicine

## 2013-07-02 ENCOUNTER — Encounter (HOSPITAL_BASED_OUTPATIENT_CLINIC_OR_DEPARTMENT_OTHER): Payer: Self-pay | Admitting: *Deleted

## 2013-07-02 ENCOUNTER — Emergency Department (HOSPITAL_BASED_OUTPATIENT_CLINIC_OR_DEPARTMENT_OTHER): Payer: Self-pay

## 2013-07-02 DIAGNOSIS — Z8739 Personal history of other diseases of the musculoskeletal system and connective tissue: Secondary | ICD-10-CM | POA: Insufficient documentation

## 2013-07-02 DIAGNOSIS — R110 Nausea: Secondary | ICD-10-CM | POA: Insufficient documentation

## 2013-07-02 DIAGNOSIS — I10 Essential (primary) hypertension: Secondary | ICD-10-CM | POA: Insufficient documentation

## 2013-07-02 DIAGNOSIS — Z8742 Personal history of other diseases of the female genital tract: Secondary | ICD-10-CM | POA: Insufficient documentation

## 2013-07-02 DIAGNOSIS — Z79899 Other long term (current) drug therapy: Secondary | ICD-10-CM | POA: Insufficient documentation

## 2013-07-02 DIAGNOSIS — M549 Dorsalgia, unspecified: Secondary | ICD-10-CM | POA: Insufficient documentation

## 2013-07-02 DIAGNOSIS — R109 Unspecified abdominal pain: Secondary | ICD-10-CM | POA: Insufficient documentation

## 2013-07-02 DIAGNOSIS — R197 Diarrhea, unspecified: Secondary | ICD-10-CM | POA: Insufficient documentation

## 2013-07-02 DIAGNOSIS — F17200 Nicotine dependence, unspecified, uncomplicated: Secondary | ICD-10-CM | POA: Insufficient documentation

## 2013-07-02 DIAGNOSIS — J45909 Unspecified asthma, uncomplicated: Secondary | ICD-10-CM | POA: Insufficient documentation

## 2013-07-02 DIAGNOSIS — R630 Anorexia: Secondary | ICD-10-CM | POA: Insufficient documentation

## 2013-07-02 DIAGNOSIS — G8929 Other chronic pain: Secondary | ICD-10-CM | POA: Insufficient documentation

## 2013-07-02 DIAGNOSIS — Z91040 Latex allergy status: Secondary | ICD-10-CM | POA: Insufficient documentation

## 2013-07-02 DIAGNOSIS — F341 Dysthymic disorder: Secondary | ICD-10-CM | POA: Insufficient documentation

## 2013-07-02 LAB — CBC WITH DIFFERENTIAL/PLATELET
Basophils Absolute: 0 10*3/uL (ref 0.0–0.1)
Basophils Relative: 0 % (ref 0–1)
Eosinophils Relative: 2 % (ref 0–5)
HCT: 39.4 % (ref 36.0–46.0)
MCHC: 34.3 g/dL (ref 30.0–36.0)
MCV: 87.8 fL (ref 78.0–100.0)
Monocytes Absolute: 0.5 10*3/uL (ref 0.1–1.0)
Monocytes Relative: 4 % (ref 3–12)
RDW: 13.6 % (ref 11.5–15.5)

## 2013-07-02 LAB — URINALYSIS, ROUTINE W REFLEX MICROSCOPIC
Glucose, UA: NEGATIVE mg/dL
Protein, ur: NEGATIVE mg/dL
Urobilinogen, UA: 0.2 mg/dL (ref 0.0–1.0)

## 2013-07-02 LAB — COMPREHENSIVE METABOLIC PANEL
AST: 24 U/L (ref 0–37)
Albumin: 3.5 g/dL (ref 3.5–5.2)
BUN: 8 mg/dL (ref 6–23)
CO2: 24 mEq/L (ref 19–32)
Calcium: 9.4 mg/dL (ref 8.4–10.5)
Creatinine, Ser: 0.6 mg/dL (ref 0.50–1.10)
GFR calc non Af Amer: >90 mL/min (ref >90)

## 2013-07-02 LAB — LIPASE, BLOOD: Lipase: 13 U/L (ref 11–59)

## 2013-07-02 MED ORDER — HYDROMORPHONE HCL PF 1 MG/ML IJ SOLN
1.0000 mg | Freq: Once | INTRAMUSCULAR | Status: AC
  Administered 2013-07-02: 1 mg via INTRAVENOUS
  Filled 2013-07-02: qty 1

## 2013-07-02 MED ORDER — IOHEXOL 300 MG/ML  SOLN
50.0000 mL | Freq: Once | INTRAMUSCULAR | Status: AC | PRN
  Administered 2013-07-02: 50 mL via ORAL

## 2013-07-02 MED ORDER — ONDANSETRON HCL 4 MG/2ML IJ SOLN
4.0000 mg | Freq: Once | INTRAMUSCULAR | Status: AC
  Administered 2013-07-02: 4 mg via INTRAVENOUS
  Filled 2013-07-02: qty 2

## 2013-07-02 MED ORDER — SODIUM CHLORIDE 0.9 % IV BOLUS (SEPSIS)
1000.0000 mL | Freq: Once | INTRAVENOUS | Status: AC
  Administered 2013-07-02: 1000 mL via INTRAVENOUS

## 2013-07-02 MED ORDER — HYDROCODONE-ACETAMINOPHEN 5-325 MG PO TABS: 1.0000 | ORAL_TABLET | ORAL | Status: DC | PRN

## 2013-07-02 MED ORDER — IOHEXOL 300 MG/ML  SOLN
100.0000 mL | Freq: Once | INTRAMUSCULAR | Status: AC | PRN
  Administered 2013-07-02: 100 mL via INTRAVENOUS

## 2013-07-02 NOTE — ED Provider Notes (Signed)
Medical screening examination/treatment/procedure(s) were conducted as a shared visit with non-physician practitioner(s) and myself.  I personally evaluated the patient during the encounter  Pt with pelvic pain x 2 weeks, neg workup at [facility] several days ago, continued severe pain, repeat CT neg. Advised Gyn followup for continued eval.   Charles B. Bernette Mayers, MD 07/02/13 2049

## 2013-07-02 NOTE — ED Notes (Signed)
PA at bedside for reassessment

## 2013-07-02 NOTE — ED Notes (Signed)
PA at bedside.

## 2013-07-02 NOTE — ED Notes (Signed)
Patient with lower abdominal pain and was seen at [facility] recently for same.  [facility] did [place] and CT scans and patient was d/c and to f/u with GI in 2 days.  Patient states that pain is worse.

## 2013-07-02 NOTE — ED Notes (Signed)
Patient transported to CT ambulatory with tech. 

## 2013-07-02 NOTE — ED Notes (Signed)
Pt ambulatory to restroom

## 2013-07-02 NOTE — ED Provider Notes (Signed)
CSN: [id number]     Arrival date & time 07/02/13  1403 History     First MD Initiated Contact with Patient 07/02/13 1419     Chief Complaint  Patient presents with  . Abdominal Pain   (Consider location/radiation/quality/duration/timing/severity/associated sxs/prior Treatment) HPI Comments: 42 year old female presents to the ER after having been seen at [facility] 2 days ago for the same thing.  She reports a two week history of severe lower abdominal pain with associated nausea without vomiting and diarrhea.  She reports no urinary symptoms, vomiting, fever or chills.  She was supposed to follow up with GI after her work from there but she has not been able to.  She had normal labs, CT and [place].  She denies vaginal discharge, bleeding, or pain with intercourse.  Patient is a 42 y.o. female presenting with abdominal pain. The history is provided by the patient. No language interpreter was used.  Abdominal Pain Pain location:  LLQ and RLQ Pain quality: bloating, sharp and shooting   Pain quality: not aching, not burning, not cramping, no fullness, not heavy, no pressure, no stiffness, not throbbing and not tugging   Pain radiates to:  Does not radiate Pain severity:  Severe Onset quality:  Gradual Duration:  2 weeks Timing:  Constant Progression:  Worsening Chronicity:  New Context: not alcohol use, not diet changes, not eating, not medication withdrawal, not recent illness, not recent sexual activity, not retching and not sick contacts   Relieved by:  Nothing Worsened by:  Nothing tried Ineffective treatments:  None tried Associated symptoms: anorexia, diarrhea and nausea   Associated symptoms: no chest pain, no chills, no constipation, no cough, no dysuria, no fever, no hematuria, no melena, no shortness of breath, no vaginal bleeding, no vaginal discharge and no vomiting   Risk factors: obesity     Past Medical History  Diagnosis Date  . Hypertension   . Asthma   . Depression  with anxiety   . Chronic back pain   . DDD (degenerative disc disease), lumbar   . Endometriosis   . Ectopic pregnancy    Past Surgical History  Procedure Laterality Date  . Ectopic pregnancy surgery    . Peri anal cyst    . Ectopic pregnancy surgery    . Dilation and curettage of uterus     History reviewed. No pertinent family history. History  Substance Use Topics  . Smoking status: Current Every Day Smoker -- 0.50 packs/day    Types: Cigarettes  . Smokeless tobacco: Not on file  . Alcohol Use: No   OB History   Grav Para Term Preterm Abortions TAB SAB Ect Mult Living                 Review of Systems  Constitutional: Negative for fever and chills.  Respiratory: Negative for cough and shortness of breath.   Cardiovascular: Negative for chest pain.  Gastrointestinal: Positive for nausea, abdominal pain, diarrhea and anorexia. Negative for vomiting, constipation and melena.  Genitourinary: Negative for dysuria, hematuria, vaginal bleeding and vaginal discharge.  All other systems reviewed and are negative.    Allergies  Erythromycin base and Latex  Home Medications   Current Outpatient Rx  Name  Route  Sig  Dispense  Refill  . Aspirin-Acetaminophen-Caffeine (GOODYS EXTRA STRENGTH PO)   Oral   Take 1 packet by mouth 2 (two) times daily.         . diphenhydrAMINE (BENADRYL) 25 MG tablet  Oral   Take 50 mg by mouth 2 (two) times daily as needed for allergies. For allergies         . HYDROcodone-acetaminophen (NORCO/VICODIN) 5-325 MG per tablet      1 or 2 tabs PO q6 hours prn pain   20 tablet   0   . HYDROcodone-acetaminophen (NORCO/VICODIN) 5-325 MG per tablet   Oral   Take 1 tablet by mouth every 4 (four) hours as needed for pain.   20 tablet   0   . ibuprofen (ADVIL,MOTRIN) 200 MG tablet   Oral   Take 800 mg by mouth every 6 (six) hours as needed for pain. As needed         . metroNIDAZOLE (FLAGYL) 500 MG tablet   Oral   Take 1 tablet (500  mg total) by mouth 2 (two) times daily.   14 tablet   0    BP 147/91  Pulse 83  Temp(Src) 98.2 F (36.8 C) (Oral)  Resp 16  Ht 5\' 1"  (1.549 m)  Wt 160 lb (72.576 kg)  BMI 30.25 kg/m2  SpO2 99%  LMP 06/15/2013 Physical Exam  Nursing note and vitals reviewed. Constitutional: She is oriented to person, place, and time. She appears well-developed and well-nourished. She appears distressed.  HENT:  Head: Normocephalic and atraumatic.  Right Ear: External ear normal.  Left Ear: External ear normal.  Nose: Nose normal.  Mouth/Throat: Oropharynx is clear and moist. No oropharyngeal exudate.  Eyes: Conjunctivae are normal. Pupils are equal, round, and reactive to light. No scleral icterus.  Neck: Normal range of motion. Neck supple.  Cardiovascular: Normal rate, regular rhythm and normal heart sounds.  Exam reveals no gallop and no friction rub.   No murmur heard. Pulmonary/Chest: Effort normal and breath sounds normal. No respiratory distress. She has no wheezes. She has no rales. She exhibits no tenderness.  Abdominal: Soft. Bowel sounds are normal. She exhibits no distension and no mass. There is tenderness. There is guarding. There is no rigidity and no rebound.    Musculoskeletal: Normal range of motion. She exhibits no edema and no tenderness.  Lymphadenopathy:    She has no cervical adenopathy.  Neurological: She is alert and oriented to person, place, and time. No cranial nerve deficit.  Skin: Skin is warm and dry. No rash noted. No erythema. No pallor.  Psychiatric: She has a normal mood and affect. Her behavior is normal. Judgment and thought content normal.    ED Course   Procedures (including critical care time)  Labs Reviewed  CBC WITH DIFFERENTIAL - Abnormal; Notable for the following:    WBC 11.2 (*)    Neutro Abs 8.5 (*)    All other components within normal limits  COMPREHENSIVE METABOLIC PANEL - Abnormal; Notable for the following:    Glucose, Bld 115 (*)      Total Bilirubin 0.1 (*)    All other components within normal limits  URINALYSIS, ROUTINE W REFLEX MICROSCOPIC - Abnormal; Notable for the following:    APPearance CLOUDY (*)    All other components within normal limits  LIPASE, BLOOD   Ct Abdomen Pelvis W Contrast  07/02/2013   CLINICAL DATA:  Lower abdominal pain. Leukocytosis.  Endometriosis.  EXAM: CT ABDOMEN AND PELVIS WITH CONTRAST  TECHNIQUE: Multidetector CT imaging of the abdomen and pelvis was performed using the standard protocol following bolus administration of intravenous contrast.  CONTRAST:  OMNIPAQUE IOHEXOL 300 MG/ML  SOLN  COMPARISON:  06/29/2013  FINDINGS: The abdominal parenchymal organs are normal in appearance. Gallbladder is unremarkable. No evidence of hydronephrosis.  Uterus and adnexal regions are unremarkable in appearance. No soft tissue masses or lymphadenopathy identified. No evidence of inflammatory process or abnormal fluid collections. No evidence of bowel wall thickening, dilatation, or hernia.  IMPRESSION: Negative. No acute findings or other significant abnormality identified.   Electronically Signed   By: Myles Rosenthal   On: 07/02/2013 17:39   Results for orders placed during the hospital encounter of 07/02/13  CBC WITH DIFFERENTIAL      Result Value Range   WBC 11.2 (*) 4.0 - 10.5 K/uL   RBC 4.49  3.87 - 5.11 MIL/uL   Hemoglobin 13.5  12.0 - 15.0 g/dL   HCT 10.2  72.5 - 36.6 %   MCV 87.8  78.0 - 100.0 fL   MCH 30.1  26.0 - 34.0 pg   MCHC 34.3  30.0 - 36.0 g/dL   RDW 44.0  34.7 - 42.5 %   Platelets 399  150 - 400 K/uL   Neutrophils Relative % 76  43 - 77 %   Neutro Abs 8.5 (*) 1.7 - 7.7 K/uL   Lymphocytes Relative 18  12 - 46 %   Lymphs Abs 2.0  0.7 - 4.0 K/uL   Monocytes Relative 4  3 - 12 %   Monocytes Absolute 0.5  0.1 - 1.0 K/uL   Eosinophils Relative 2  0 - 5 %   Eosinophils Absolute 0.2  0.0 - 0.7 K/uL   Basophils Relative 0  0 - 1 %   Basophils Absolute 0.0  0.0 - 0.1 K/uL   COMPREHENSIVE METABOLIC PANEL      Result Value Range   Sodium 140  135 - 145 mEq/L   Potassium 3.5  3.5 - 5.1 mEq/L   Chloride 105  96 - 112 mEq/L   CO2 24  19 - 32 mEq/L   Glucose, Bld 115 (*) 70 - 99 mg/dL   BUN 8  6 - 23 mg/dL   Creatinine, Ser 9.56  0.50 - 1.10 mg/dL   Calcium 9.4  8.4 - 38.7 mg/dL   Total Protein 6.7  6.0 - 8.3 g/dL   Albumin 3.5  3.5 - 5.2 g/dL   AST 24  0 - 37 U/L   ALT 30  0 - 35 U/L   Alkaline Phosphatase 99  39 - 117 U/L   Total Bilirubin 0.1 (*) 0.3 - 1.2 mg/dL   GFR calc non Af Amer >90  >90 mL/min   GFR calc Af Amer >90  >90 mL/min  LIPASE, BLOOD      Result Value Range   Lipase 13  11 - 59 U/L  URINALYSIS, ROUTINE W REFLEX MICROSCOPIC      Result Value Range   Color, Urine YELLOW  YELLOW   APPearance CLOUDY (*) CLEAR   Specific Gravity, Urine 1.007  1.005 - 1.030   pH 7.0  5.0 - 8.0   Glucose, UA NEGATIVE  NEGATIVE mg/dL   Hgb urine dipstick NEGATIVE  NEGATIVE   Bilirubin Urine NEGATIVE  NEGATIVE   Ketones, ur NEGATIVE  NEGATIVE mg/dL   Protein, ur NEGATIVE  NEGATIVE mg/dL   Urobilinogen, UA 0.2  0.0 - 1.0 mg/dL   Nitrite NEGATIVE  NEGATIVE   Leukocytes, UA NEGATIVE  NEGATIVE   US Transvaginal Non-ob  06/29/2013   *RADIOLOGY REPORT*  Clinical Data:  Pelvic pain.  Rule out torsion.  LMP 06/15/2013.  TRANSABDOMINAL  AND TRANSVAGINAL ULTRASOUND OF PELVIS DOPPLER ULTRASOUND OF OVARIES  Technique:  Both transabdominal and transvaginal ultrasound examinations of the pelvis were performed. Transabdominal technique was performed for global imaging of the pelvis including uterus, ovaries, adnexal regions, and pelvic cul-de-sac.  It was necessary to proceed with endovaginal exam following the transabdominal exam to visualize the uterus, endometrium.  Color and duplex Doppler ultrasound was utilized to evaluate blood flow to the ovaries.  Comparison:  CT of the abdomen and pelvis on 06/29/2013  FINDINGS  Uterus:  6.8 x 4.3 x 4.9 cm.  Nabothian cyst  incidentally noted.  Endometrium:  Normal in appearance, 0.6 cm in thickness.  Right ovary: Normal in appearance, 3.9 x 3.0 x 2.4 cm.  Left ovary: 2.6 x 1.8 x 1.3 cm.  Normal in appearance.  Pulsed Doppler evaluation demonstrates normal low-resistance arterial and venous waveforms in both ovaries.  IMPRESSION:  1.Normal exam.  No evidence of pelvic mass or other significant abnormality. 2.No sonographic evidence for ovarian torsion.   Original Report Authenticated By: Norva Pavlov, M.D.   US Pelvis Complete  06/29/2013   *RADIOLOGY REPORT*  Clinical Data:  Pelvic pain.  Rule out torsion.  LMP 06/15/2013.  TRANSABDOMINAL AND TRANSVAGINAL ULTRASOUND OF PELVIS DOPPLER ULTRASOUND OF OVARIES  Technique:  Both transabdominal and transvaginal ultrasound examinations of the pelvis were performed. Transabdominal technique was performed for global imaging of the pelvis including uterus, ovaries, adnexal regions, and pelvic cul-de-sac.  It was necessary to proceed with endovaginal exam following the transabdominal exam to visualize the uterus, endometrium.  Color and duplex Doppler ultrasound was utilized to evaluate blood flow to the ovaries.  Comparison:  CT of the abdomen and pelvis on 06/29/2013  FINDINGS  Uterus:  6.8 x 4.3 x 4.9 cm.  Nabothian cyst incidentally noted.  Endometrium:  Normal in appearance, 0.6 cm in thickness.  Right ovary: Normal in appearance, 3.9 x 3.0 x 2.4 cm.  Left ovary: 2.6 x 1.8 x 1.3 cm.  Normal in appearance.  Pulsed Doppler evaluation demonstrates normal low-resistance arterial and venous waveforms in both ovaries.  IMPRESSION:  1.Normal exam.  No evidence of pelvic mass or other significant abnormality. 2.No sonographic evidence for ovarian torsion.   Original Report Authenticated By: Norva Pavlov, M.D.   Ct Abdomen Pelvis W Contrast  07/02/2013   CLINICAL DATA:  Lower abdominal pain. Leukocytosis.  Endometriosis.  EXAM: CT ABDOMEN AND PELVIS WITH CONTRAST  TECHNIQUE: Multidetector  CT imaging of the abdomen and pelvis was performed using the standard protocol following bolus administration of intravenous contrast.  CONTRAST:  OMNIPAQUE IOHEXOL 300 MG/ML  SOLN  COMPARISON:  06/29/2013  FINDINGS: The abdominal parenchymal organs are normal in appearance. Gallbladder is unremarkable. No evidence of hydronephrosis.  Uterus and adnexal regions are unremarkable in appearance. No soft tissue masses or lymphadenopathy identified. No evidence of inflammatory process or abnormal fluid collections. No evidence of bowel wall thickening, dilatation, or hernia.  IMPRESSION: Negative. No acute findings or other significant abnormality identified.   Electronically Signed   By: Myles Rosenthal   On: 07/02/2013 17:39   Ct Abdomen Pelvis W Contrast  06/29/2013   *RADIOLOGY REPORT*  Clinical Data: Pain  CT ABDOMEN AND PELVIS WITH CONTRAST  Technique:  Multidetector CT imaging of the abdomen and pelvis was performed following the standard protocol during bolus administration of intravenous contrast.  Contrast: OMNIPAQUE IOHEXOL 300 MG/ML  SOLN  Comparison: 04/06/2010  Findings: The lung bases are free of acute infiltrate or sizable  effusion.  The liver, gallbladder, spleen, adrenal glands and pancreas are all normal in their CT appearance.  The kidneys are well visualized bilaterally and reveal no renal calculi or obstructive changes. Delayed images demonstrate adequate excretion of contrast material.  The appendix is not visualized and may have been surgically removed.  The bladder is well distended without opacified urine. No pelvic mass lesion or side wall abnormality is seen.  The previously noted perianal abscess has resolved in the interval.  No other focal abnormality is seen.  IMPRESSION: No acute abnormality noted.   Original Report Authenticated By: Alcide Clever, M.D.   [place] Art/ven Flow Abd Pelv Doppler  06/29/2013   *RADIOLOGY REPORT*  Clinical Data:  Pelvic pain.  Rule out torsion.  LMP  06/15/2013.  TRANSABDOMINAL AND TRANSVAGINAL ULTRASOUND OF PELVIS DOPPLER ULTRASOUND OF OVARIES  Technique:  Both transabdominal and transvaginal ultrasound examinations of the pelvis were performed. Transabdominal technique was performed for global imaging of the pelvis including uterus, ovaries, adnexal regions, and pelvic cul-de-sac.  It was necessary to proceed with endovaginal exam following the transabdominal exam to visualize the uterus, endometrium.  Color and duplex Doppler ultrasound was utilized to evaluate blood flow to the ovaries.  Comparison:  CT of the abdomen and pelvis on 06/29/2013  FINDINGS  Uterus:  6.8 x 4.3 x 4.9 cm.  Nabothian cyst incidentally noted.  Endometrium:  Normal in appearance, 0.6 cm in thickness.  Right ovary: Normal in appearance, 3.9 x 3.0 x 2.4 cm.  Left ovary: 2.6 x 1.8 x 1.3 cm.  Normal in appearance.  Pulsed Doppler evaluation demonstrates normal low-resistance arterial and venous waveforms in both ovaries.  IMPRESSION:  1.Normal exam.  No evidence of pelvic mass or other significant abnormality. 2.No sonographic evidence for ovarian torsion.   Original Report Authenticated By: Norva Pavlov, M.D.     1. Abdominal pain     MDM  After discussion with Dr. Bernette Mayers, we have decided based on the level of the patient's pain and the fact that the pain has increased over the past two days that though the labs were normal we will repeat the CT scan as at this time the benefits outweight the risks.  6:37 PM Patient with no pathology noted on CT scan as well as labs.  I feel that this is then likely an exacerbation of her endometriosis.  There is no emergency medical condition at this time.  We will refer to [facility] outpatient clinic for further work up.    Izola Price Marisue Humble, [place] 07/02/13 4782

## 2013-08-15 ENCOUNTER — Encounter: Payer: Self-pay | Admitting: Obstetrics & Gynecology

## 2013-10-23 ENCOUNTER — Encounter (HOSPITAL_BASED_OUTPATIENT_CLINIC_OR_DEPARTMENT_OTHER): Payer: Self-pay | Admitting: Emergency Medicine

## 2013-10-23 ENCOUNTER — Emergency Department (HOSPITAL_BASED_OUTPATIENT_CLINIC_OR_DEPARTMENT_OTHER)
Admission: EM | Admit: 2013-10-23 | Discharge: 2013-10-23 | Disposition: A | Discharge status: Home / Self Care | Payer: Self-pay | Attending: Emergency Medicine | Admitting: Emergency Medicine

## 2013-10-23 DIAGNOSIS — J45909 Unspecified asthma, uncomplicated: Secondary | ICD-10-CM | POA: Insufficient documentation

## 2013-10-23 DIAGNOSIS — Z8659 Personal history of other mental and behavioral disorders: Secondary | ICD-10-CM | POA: Insufficient documentation

## 2013-10-23 DIAGNOSIS — I10 Essential (primary) hypertension: Secondary | ICD-10-CM | POA: Insufficient documentation

## 2013-10-23 DIAGNOSIS — X503XXA Overexertion from repetitive movements, initial encounter: Secondary | ICD-10-CM | POA: Insufficient documentation

## 2013-10-23 DIAGNOSIS — Z91040 Latex allergy status: Secondary | ICD-10-CM | POA: Insufficient documentation

## 2013-10-23 DIAGNOSIS — G8929 Other chronic pain: Secondary | ICD-10-CM | POA: Insufficient documentation

## 2013-10-23 DIAGNOSIS — Z79899 Other long term (current) drug therapy: Secondary | ICD-10-CM | POA: Insufficient documentation

## 2013-10-23 DIAGNOSIS — Z8742 Personal history of other diseases of the female genital tract: Secondary | ICD-10-CM | POA: Insufficient documentation

## 2013-10-23 DIAGNOSIS — M62830 Muscle spasm of back: Secondary | ICD-10-CM

## 2013-10-23 DIAGNOSIS — F17200 Nicotine dependence, unspecified, uncomplicated: Secondary | ICD-10-CM | POA: Insufficient documentation

## 2013-10-23 DIAGNOSIS — S239XXA Sprain of unspecified parts of thorax, initial encounter: Secondary | ICD-10-CM | POA: Insufficient documentation

## 2013-10-23 DIAGNOSIS — M5380 Other specified dorsopathies, site unspecified: Secondary | ICD-10-CM | POA: Insufficient documentation

## 2013-10-23 DIAGNOSIS — Y9389 Activity, other specified: Secondary | ICD-10-CM | POA: Insufficient documentation

## 2013-10-23 DIAGNOSIS — Y929 Unspecified place or not applicable: Secondary | ICD-10-CM | POA: Insufficient documentation

## 2013-10-23 MED ORDER — MELOXICAM 7.5 MG PO TABS: 7.5000 mg | ORAL_TABLET | Freq: Every day | ORAL | Status: DC

## 2013-10-23 MED ORDER — CYCLOBENZAPRINE HCL 10 MG PO TABS
10.0000 mg | ORAL_TABLET | Freq: Once | ORAL | Status: AC
  Administered 2013-10-23: 10 mg via ORAL
  Filled 2013-10-23: qty 1

## 2013-10-23 MED ORDER — OXYCODONE-ACETAMINOPHEN 5-325 MG PO TABS
1.0000 | ORAL_TABLET | Freq: Once | ORAL | Status: AC
  Administered 2013-10-23: 1 via ORAL
  Filled 2013-10-23: qty 1

## 2013-10-23 MED ORDER — DIAZEPAM 5 MG PO TABS
5.0000 mg | ORAL_TABLET | Freq: Once | ORAL | Status: AC
  Administered 2013-10-23: 5 mg via ORAL
  Filled 2013-10-23: qty 1

## 2013-10-23 MED ORDER — ONDANSETRON 4 MG PO TBDP
4.0000 mg | ORAL_TABLET | Freq: Once | ORAL | Status: AC
  Administered 2013-10-23: 4 mg via ORAL
  Filled 2013-10-23: qty 1

## 2013-10-23 MED ORDER — PREDNISONE 50 MG PO TABS
60.0000 mg | ORAL_TABLET | Freq: Once | ORAL | Status: AC
  Administered 2013-10-23: 60 mg via ORAL
  Filled 2013-10-23 (×2): qty 1

## 2013-10-23 MED ORDER — DIAZEPAM 5 MG PO TABS: 5.0000 mg | ORAL_TABLET | Freq: Two times a day (BID) | ORAL | Status: DC

## 2013-10-23 NOTE — ED Provider Notes (Signed)
CSN: [id number]     Arrival date & time 10/23/13  1209 History   First MD Initiated Contact with Patient 10/23/13 1311     Chief Complaint  Patient presents with  . Back Pain   (Consider location/radiation/quality/duration/timing/severity/associated sxs/prior Treatment) Patient is a 42 y.o. female presenting with back pain.  Back Pain Location:  Thoracic spine Pain severity:  Severe Pain is:  Same all the time Onset quality:  Gradual Duration:  2 days Timing:  Constant Progression:  Worsening Context: not recent injury   Relieved by:  Nothing Worsened by:  Coughing, deep breathing, movement, twisting and ambulation Ineffective treatments:  Ibuprofen Associated symptoms: no bladder incontinence, no bowel incontinence, no chest pain, no dysuria, no fever, no headaches, no leg pain, no numbness, no paresthesias and no tingling    Deanna [name] is a 42 y.o. female who presents to the ED with back pain that started 2 days ago. The pain has increased and is now severe. She has a history of chronic back pain. 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 Taking ibuprofen without relief. She describes the pain as severe and and in the thoracic area bilateral. The pain radiates to both sides but does not radiate down her legs.   Past Medical History  Diagnosis Date  . Hypertension   . Asthma   . Depression with anxiety   . Chronic back pain   . DDD (degenerative disc disease), lumbar   . Endometriosis   . Ectopic pregnancy    Past Surgical History  Procedure Laterality Date  . Ectopic pregnancy surgery    . Peri anal cyst    . Ectopic pregnancy surgery    . Dilation and curettage of uterus     No family history on file. History  Substance Use Topics  . Smoking status: Current Every Day Smoker -- 0.50 packs/day    Types: Cigarettes  . Smokeless tobacco: Not on file  . Alcohol Use: No   OB History   Grav Para Term Preterm  Abortions TAB SAB Ect Mult Living                 Review of Systems  Constitutional: Negative for fever and chills.  HENT: Negative.   Eyes: Negative for visual disturbance.  Respiratory: Negative for cough and wheezing.   Cardiovascular: Negative for chest pain.  Gastrointestinal: Negative for nausea, vomiting and bowel incontinence.  Genitourinary: Negative for bladder incontinence, dysuria, urgency and frequency.  Musculoskeletal: Positive for back pain. Negative for neck pain.  Skin: Negative for rash.  Neurological: Negative for tingling, numbness, headaches and paresthesias.  Psychiatric/Behavioral: Negative for confusion. The patient is not nervous/anxious.     Allergies  Erythromycin base and Latex  Home Medications   Current Outpatient Rx  Name  Route  Sig  Dispense  Refill  . Aspirin-Acetaminophen-Caffeine (GOODYS EXTRA STRENGTH PO)   Oral   Take 1 packet by mouth 2 (two) times daily.         Marland Kitchen ibuprofen (ADVIL,MOTRIN) 200 MG tablet   Oral   Take 800 mg by mouth every 6 (six) hours as needed for pain. As needed          BP 204/100  Pulse 90  Temp(Src) 98.8 F (37.1 C) (Oral)  Resp 18  SpO2 100% Physical Exam  Nursing note and vitals reviewed. Constitutional: She is oriented to person, place, and time. She appears well-developed and well-nourished.  HENT:  Head: Normocephalic and atraumatic.  Eyes: Conjunctivae and EOM are normal.  Neck: Normal range of motion. Neck supple.  Cardiovascular: Normal rate, regular rhythm and normal heart sounds.   Pulmonary/Chest: Effort normal and breath sounds normal.  Abdominal: Soft. Bowel sounds are normal. There is no tenderness.  Musculoskeletal:       Thoracic back: She exhibits decreased range of motion, tenderness and spasm. She exhibits no deformity and normal pulse.       Back:  No tenderness over the spinal cord. Pedal pulses equal, adequate circulation, good touch sensation.  Neurological: She is alert  and oriented to person, place, and time. She has normal strength and normal reflexes. No cranial nerve deficit or sensory deficit. Gait normal.  Skin: Skin is warm and dry.  Psychiatric: She has a normal mood and affect. Her behavior is normal.    ED Course  Procedures  Patient given muscle relaxants and pain medication and is feeling much better.  BP 175/89  Pulse 90  Temp(Src) 98.8 F (37.1 C) (Oral)  Resp 16  SpO2 99%  MDM  42 y.o. female with thoracic strain and spasm. Will continue medication for spasm and NSAIDS. She will follow up with her PCP. Stable for discharge without any immediate complications. She remains neurovascularly intact.  Discussed with the patient clinical findings and plan of care. All questioned fully answered. She will return if any problems arise.    Medication List    TAKE these medications       diazepam 5 MG tablet  Commonly known as:  VALIUM  Take 1 tablet (5 mg total) by mouth 2 (two) times daily.     meloxicam 7.5 MG tablet  Commonly known as:  MOBIC  Take 1 tablet (7.5 mg total) by mouth daily.      ASK your doctor about these medications       GOODYS EXTRA STRENGTH PO  Take 1 packet by mouth 2 (two) times daily.     ibuprofen 200 MG tablet  Commonly known as:  ADVIL,MOTRIN  Take 800 mg by mouth every 6 (six) hours as needed for pain. As needed           [facility], NP 10/24/13 1530

## 2013-10-23 NOTE — ED Notes (Signed)
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 Taking ibuprofen with minimal to no relief

## 2013-10-25 NOTE — ED Provider Notes (Signed)
Medical screening examination/treatment/procedure(s) were performed by non-physician practitioner and as supervising physician I was immediately available for consultation/collaboration.     Sachiko Methot, MD 10/25/13 1541 

## 2014-08-25 ENCOUNTER — Encounter (HOSPITAL_BASED_OUTPATIENT_CLINIC_OR_DEPARTMENT_OTHER): Payer: Self-pay | Admitting: Emergency Medicine

## 2014-08-25 ENCOUNTER — Emergency Department (HOSPITAL_BASED_OUTPATIENT_CLINIC_OR_DEPARTMENT_OTHER): Payer: Self-pay

## 2014-08-25 ENCOUNTER — Emergency Department (HOSPITAL_BASED_OUTPATIENT_CLINIC_OR_DEPARTMENT_OTHER)
Admission: EM | Admit: 2014-08-25 | Discharge: 2014-08-25 | Disposition: A | Discharge status: Home / Self Care | Payer: Self-pay | Attending: Emergency Medicine | Admitting: Emergency Medicine

## 2014-08-25 DIAGNOSIS — I10 Essential (primary) hypertension: Secondary | ICD-10-CM | POA: Insufficient documentation

## 2014-08-25 DIAGNOSIS — S0081XA Abrasion of other part of head, initial encounter: Secondary | ICD-10-CM | POA: Insufficient documentation

## 2014-08-25 DIAGNOSIS — W19XXXA Unspecified fall, initial encounter: Secondary | ICD-10-CM

## 2014-08-25 DIAGNOSIS — J45909 Unspecified asthma, uncomplicated: Secondary | ICD-10-CM | POA: Insufficient documentation

## 2014-08-25 DIAGNOSIS — W1839XA Other fall on same level, initial encounter: Secondary | ICD-10-CM | POA: Insufficient documentation

## 2014-08-25 DIAGNOSIS — F341 Dysthymic disorder: Secondary | ICD-10-CM | POA: Insufficient documentation

## 2014-08-25 DIAGNOSIS — Y9389 Activity, other specified: Secondary | ICD-10-CM | POA: Insufficient documentation

## 2014-08-25 DIAGNOSIS — G8929 Other chronic pain: Secondary | ICD-10-CM | POA: Insufficient documentation

## 2014-08-25 DIAGNOSIS — Z79899 Other long term (current) drug therapy: Secondary | ICD-10-CM | POA: Insufficient documentation

## 2014-08-25 DIAGNOSIS — S61217A Laceration without foreign body of left little finger without damage to nail, initial encounter: Secondary | ICD-10-CM | POA: Insufficient documentation

## 2014-08-25 DIAGNOSIS — Z23 Encounter for immunization: Secondary | ICD-10-CM | POA: Insufficient documentation

## 2014-08-25 DIAGNOSIS — Z791 Long term (current) use of non-steroidal anti-inflammatories (NSAID): Secondary | ICD-10-CM | POA: Insufficient documentation

## 2014-08-25 DIAGNOSIS — Z8742 Personal history of other diseases of the female genital tract: Secondary | ICD-10-CM | POA: Insufficient documentation

## 2014-08-25 DIAGNOSIS — Z3202 Encounter for pregnancy test, result negative: Secondary | ICD-10-CM | POA: Insufficient documentation

## 2014-08-25 DIAGNOSIS — S61219A Laceration without foreign body of unspecified finger without damage to nail, initial encounter: Secondary | ICD-10-CM

## 2014-08-25 DIAGNOSIS — Z720 Tobacco use: Secondary | ICD-10-CM | POA: Insufficient documentation

## 2014-08-25 DIAGNOSIS — Y93E2 Activity, laundry: Secondary | ICD-10-CM | POA: Insufficient documentation

## 2014-08-25 DIAGNOSIS — M5136 Other intervertebral disc degeneration, lumbar region: Secondary | ICD-10-CM | POA: Insufficient documentation

## 2014-08-25 LAB — CBC WITH DIFFERENTIAL/PLATELET
Basophils Absolute: 0 10*3/uL (ref 0.0–0.1)
Basophils Relative: 0 % (ref 0–1)
Eosinophils Absolute: 0.3 10*3/uL (ref 0.0–0.7)
Eosinophils Relative: 3 % (ref 0–5)
HEMATOCRIT: 37.3 % (ref 36.0–46.0)
HEMOGLOBIN: 12.3 g/dL (ref 12.0–15.0)
LYMPHS PCT: 27 % (ref 12–46)
Lymphs Abs: 2.6 10*3/uL (ref 0.7–4.0)
MCH: 28.7 pg (ref 26.0–34.0)
MCHC: 33 g/dL (ref 30.0–36.0)
MCV: 86.9 fL (ref 78.0–100.0)
MONO ABS: 0.6 10*3/uL (ref 0.1–1.0)
MONOS PCT: 6 % (ref 3–12)
NEUTROS ABS: 6 10*3/uL (ref 1.7–7.7)
Neutrophils Relative %: 64 % (ref 43–77)
Platelets: 359 10*3/uL (ref 150–400)
RBC: 4.29 MIL/uL (ref 3.87–5.11)
RDW: 14.5 % (ref 11.5–15.5)
WBC: 9.6 10*3/uL (ref 4.0–10.5)

## 2014-08-25 LAB — URINALYSIS, ROUTINE W REFLEX MICROSCOPIC
Bilirubin Urine: NEGATIVE
Glucose, UA: NEGATIVE mg/dL
Hgb urine dipstick: NEGATIVE
Ketones, ur: 15 mg/dL — AB
LEUKOCYTES UA: NEGATIVE
NITRITE: NEGATIVE
PROTEIN: NEGATIVE mg/dL
SPECIFIC GRAVITY, URINE: 1.03 (ref 1.005–1.030)
UROBILINOGEN UA: 0.2 mg/dL (ref 0.0–1.0)
pH: 6 (ref 5.0–8.0)

## 2014-08-25 LAB — COMPREHENSIVE METABOLIC PANEL
ALT: 10 U/L (ref 0–35)
ANION GAP: 16 — AB (ref 5–15)
AST: 12 U/L (ref 0–37)
Albumin: 3.3 g/dL — ABNORMAL LOW (ref 3.5–5.2)
Alkaline Phosphatase: 96 U/L (ref 39–117)
BILIRUBIN TOTAL: 0.3 mg/dL (ref 0.3–1.2)
BUN: 10 mg/dL (ref 6–23)
CHLORIDE: 102 meq/L (ref 96–112)
CO2: 22 mEq/L (ref 19–32)
CREATININE: 0.5 mg/dL (ref 0.50–1.10)
Calcium: 8.8 mg/dL (ref 8.4–10.5)
GFR calc Af Amer: >90 mL/min (ref >90)
GFR calc non Af Amer: >90 mL/min (ref >90)
Glucose, Bld: 97 mg/dL (ref 70–99)
POTASSIUM: 3.7 meq/L (ref 3.7–5.3)
Sodium: 140 mEq/L (ref 137–147)
Total Protein: 7.1 g/dL (ref 6.0–8.3)

## 2014-08-25 LAB — RAPID URINE DRUG SCREEN, HOSP PERFORMED
AMPHETAMINES: NOT DETECTED
Barbiturates: NOT DETECTED
Benzodiazepines: POSITIVE — AB
Cocaine: NOT DETECTED
Opiates: NOT DETECTED
TETRAHYDROCANNABINOL: POSITIVE — AB

## 2014-08-25 LAB — PREGNANCY, URINE: PREG TEST UR: NEGATIVE

## 2014-08-25 MED ORDER — TETANUS-DIPHTH-ACELL PERTUSSIS 5-2.5-18.5 LF-MCG/0.5 IM SUSP
0.5000 mL | Freq: Once | INTRAMUSCULAR | Status: AC
  Administered 2014-08-25: 0.5 mL via INTRAMUSCULAR
  Filled 2014-08-25: qty 0.5

## 2014-08-25 MED ORDER — AMOXICILLIN-POT CLAVULANATE 875-125 MG PO TABS: 1.0000 | ORAL_TABLET | Freq: Two times a day (BID) | ORAL | Status: DC

## 2014-08-25 MED ORDER — AMOXICILLIN-POT CLAVULANATE 875-125 MG PO TABS
1.0000 | ORAL_TABLET | Freq: Once | ORAL | Status: AC
  Administered 2014-08-25: 1 via ORAL
  Filled 2014-08-25: qty 1

## 2014-08-25 MED ORDER — HYDROMORPHONE HCL 1 MG/ML IJ SOLN
2.0000 mg | Freq: Once | INTRAMUSCULAR | Status: AC
  Administered 2014-08-25: 2 mg via INTRAMUSCULAR
  Filled 2014-08-25: qty 2

## 2014-08-25 MED ORDER — OXYCODONE-ACETAMINOPHEN 5-325 MG PO TABS
2.0000 | ORAL_TABLET | Freq: Once | ORAL | Status: AC
  Administered 2014-08-25: 2 via ORAL
  Filled 2014-08-25: qty 2

## 2014-08-25 MED ORDER — OXYCODONE-ACETAMINOPHEN 5-325 MG PO TABS: 1.0000 | ORAL_TABLET | ORAL | Status: DC | PRN

## 2014-08-25 NOTE — Discharge Instructions (Signed)
All antibiotics as prescribed. Keep splint in place. Return if redness spreads up hand or you began having a fever. Use wet to dry dressings 4 times per day. Called Dr. Merrilee SeashoreKuzma's office Monday morning for followup.  Delayed Wound Closure Sometimes, your health care provider will decide to delay closing a wound for several days. This is done when the wound is badly bruised, dirty, or when it has been several hours since the injury happened. By delaying the closure of your wound, the risk of infection is reduced. Wounds that are closed in 3-7 days after being cleaned up and dressed heal just as well as those that are closed right away. HOME CARE INSTRUCTIONS  Rest and elevate the injured area until the pain and swelling are gone.  Have your wound checked as instructed by your health care provider. SEEK MEDICAL CARE IF:  You develop unusual or increased swelling or redness around the wound.  You have increasing pain or tenderness.  There is increasing fluid (drainage) or a bad smelling drainage coming from the wound. Document Released: 11/03/2005 Document Revised: 11/08/2013 Document Reviewed: 05/03/2013 [facility] Patient Information 2015 [place], [place]. This information is not intended to replace advice given to you by your health care provider. Make sure you discuss any questions you have with your health care provider.

## 2014-08-25 NOTE — ED Notes (Signed)
Slipped and fell last night. Laceration to her left 5th digit. Abrasions to her left forehead, face, and elbow. Pain in her left hip. No LOC.

## 2014-08-25 NOTE — ED Provider Notes (Signed)
CSN: [id number]     Arrival date & time 08/25/14  1611 History   First MD Initiated Contact with Patient 08/25/14 1623     Chief Complaint  Patient presents with  . Fall     (Consider location/radiation/quality/duration/timing/severity/associated sxs/prior Treatment) HPI 43 year old female who comes in today stating that she slipped and fell yesterday while she was doing laundry. She states that she struck the left side of her body as she fell. She cut her left small finger. She has an abrasion to left side of her face with some pain around this area. She states that she went to sleep immediately after this occurred at 4:00 in the afternoon. She states that she noted left finger was laid open but that she just wrapped it up. She said she wanted to come to the hospital today by her husband would not bring her. She says that she has never driven her life. She waited until her husband got home from work and again told him that she needed to come to the hospital for evaluation. She says that he was angry with her for getting her stated that she had been clumsy. She says that he is not abusive to her and has never hurt her. She specifically denies to me that this was caused by abuse. She says that she has a disabled 43 year old son at home and that she has to change his she every day because he went to bed. She states that this is what she was doing when she fell. She says that she knows she has high blood pressure but has not taken medication for the past 7 years as she has not had a doctor since her husband had to take a different job he did not pay as well as his previous job. She is unable to determine when her last tetanus shot was.  Past Medical History  Diagnosis Date  . Hypertension   . Asthma   . Depression with anxiety   . Chronic back pain   . DDD (degenerative disc disease), lumbar   . Endometriosis   . Ectopic pregnancy    Past Surgical History  Procedure Laterality Date  . Ectopic  pregnancy surgery    . Peri anal cyst    . Ectopic pregnancy surgery    . Dilation and curettage of uterus     No family history on file. History  Substance Use Topics  . Smoking status: Current Every Day Smoker -- 2.00 packs/day    Types: Cigarettes  . Smokeless tobacco: Not on file  . Alcohol Use: No   OB History   Grav Para Term Preterm Abortions TAB SAB Ect Mult Living                 Review of Systems  All other systems reviewed and are negative.     Allergies  Erythromycin base and Latex  Home Medications   Prior to Admission medications   Medication Sig Start Date End Date Taking? Authorizing Provider  Aspirin-Acetaminophen-Caffeine (GOODYS EXTRA STRENGTH PO) Take 1 packet by mouth 2 (two) times daily.    Historical Provider, MD  diazepam (VALIUM) 5 MG tablet Take 1 tablet (5 mg total) by mouth 2 (two) times daily. 10/23/13   Hope Orlene OchM Neese, NP  ibuprofen (ADVIL,MOTRIN) 200 MG tablet Take 800 mg by mouth every 6 (six) hours as needed for pain. As needed    Historical Provider, MD  meloxicam (MOBIC) 7.5 MG tablet Take 1 tablet (7.5 mg  total) by mouth daily. 10/23/13   Hope Orlene OchM Neese, NP   BP 185/100  Pulse 92  Temp(Src) 98.4 F (36.9 C) (Oral)  Resp 20  Ht 5\' 1"  (1.549 m)  Wt 155 lb (70.308 kg)  BMI 29.30 kg/m2  SpO2 92%  LMP 08/04/2014 Physical Exam  Nursing note and vitals reviewed. Constitutional: She is oriented to person, place, and time. She appears well-developed and well-nourished.  HENT:  Head:    Right Ear: External ear normal.  Left Ear: External ear normal.  Nose: Nose normal.  Mouth/Throat: Oropharynx is clear and moist.  Eyes: Conjunctivae are normal. Pupils are equal, round, and reactive to light.  Neck: Normal range of motion. Neck supple.  Cardiovascular: Normal rate, regular rhythm and normal heart sounds.   Pulmonary/Chest: Effort normal and breath sounds normal. She exhibits tenderness.    Abdominal: Soft. Bowel sounds are normal.    Musculoskeletal:       Arms:      Left hand: She exhibits tenderness and deformity. She exhibits normal range of motion, no bony tenderness, normal two-point discrimination, normal capillary refill and no swelling. Decreased sensation noted. Decreased sensation is not present in the radial distribution. Normal strength noted. She exhibits no wrist extension trouble.       Hands: Laceration palmar surface fifth digit left hand with decreased flexion, unable to exam to depth of wound secondary to swelling. Patient with decreased flexion but also swelling limits rom.  Two point sensation intact.   Neurological: She is alert and oriented to person, place, and time.  Skin: Skin is warm and dry.  Psychiatric: She has a normal mood and affect. Her behavior is normal. Judgment and thought content normal.    ED Course  Procedures (including critical care time) Labs Review Labs Reviewed  CBC WITH DIFFERENTIAL  COMPREHENSIVE METABOLIC PANEL  URINE RAPID DRUG SCREEN (HOSP PERFORMED)  URINALYSIS, ROUTINE W REFLEX MICROSCOPIC  PREGNANCY, URINE        Imaging Review Ct Head Wo Contrast  08/25/2014   CLINICAL DATA:  Larey SeatFell down stairs yesterday with loss of consciousness. Head injury.  EXAM: CT HEAD WITHOUT CONTRAST  TECHNIQUE: Contiguous axial images were obtained from the base of the skull through the vertex without intravenous contrast.  COMPARISON:  None.  FINDINGS: Ventricle size is normal. Negative for acute or chronic infarction. Negative for hemorrhage or fluid collection. Negative for mass or edema. No shift of the midline structures.  Calvarium is intact.  Small frontal scalp contusion.  IMPRESSION: Normal CT of the brain.  Small frontal scalp contusion   Electronically Signed   By: Marlan Palauharles  Clark M.D.   On: 08/25/2014 17:05     EKG Interpretation None      MDM   Final diagnoses:  Fall    Patient with abrasion to face and laceration to finger >24 hours.  Patient with tdap updated.   Wound cleaned- discussed with Dr.Kuzma and plan augmentin and follow up in office for secondary closure.  Discussed with patient indications for return - including increased pain, swelling, redness.  Impressed need to take antibiotic as prescribed and need for close follow up.     Hilario Quarryanielle S Kenzy Campoverde, MD 08/25/14 [phone]

## 2014-12-11 ENCOUNTER — Emergency Department (HOSPITAL_COMMUNITY)
Admission: EM | Admit: 2014-12-11 | Discharge: 2014-12-11 | Disposition: A | Discharge status: Home / Self Care | Payer: Self-pay | Attending: Emergency Medicine | Admitting: Emergency Medicine

## 2014-12-11 ENCOUNTER — Emergency Department (HOSPITAL_COMMUNITY): Payer: Self-pay

## 2014-12-11 ENCOUNTER — Encounter (HOSPITAL_COMMUNITY): Payer: Self-pay | Admitting: *Deleted

## 2014-12-11 DIAGNOSIS — I10 Essential (primary) hypertension: Secondary | ICD-10-CM | POA: Insufficient documentation

## 2014-12-11 DIAGNOSIS — F419 Anxiety disorder, unspecified: Secondary | ICD-10-CM | POA: Insufficient documentation

## 2014-12-11 DIAGNOSIS — M545 Low back pain: Secondary | ICD-10-CM | POA: Insufficient documentation

## 2014-12-11 DIAGNOSIS — N12 Tubulo-interstitial nephritis, not specified as acute or chronic: Secondary | ICD-10-CM | POA: Insufficient documentation

## 2014-12-11 DIAGNOSIS — Z8742 Personal history of other diseases of the female genital tract: Secondary | ICD-10-CM | POA: Insufficient documentation

## 2014-12-11 DIAGNOSIS — Z720 Tobacco use: Secondary | ICD-10-CM | POA: Insufficient documentation

## 2014-12-11 DIAGNOSIS — G8929 Other chronic pain: Secondary | ICD-10-CM | POA: Insufficient documentation

## 2014-12-11 DIAGNOSIS — Z79899 Other long term (current) drug therapy: Secondary | ICD-10-CM | POA: Insufficient documentation

## 2014-12-11 DIAGNOSIS — J45909 Unspecified asthma, uncomplicated: Secondary | ICD-10-CM | POA: Insufficient documentation

## 2014-12-11 DIAGNOSIS — Z7982 Long term (current) use of aspirin: Secondary | ICD-10-CM | POA: Insufficient documentation

## 2014-12-11 DIAGNOSIS — Z3202 Encounter for pregnancy test, result negative: Secondary | ICD-10-CM | POA: Insufficient documentation

## 2014-12-11 DIAGNOSIS — R109 Unspecified abdominal pain: Secondary | ICD-10-CM

## 2014-12-11 DIAGNOSIS — Z791 Long term (current) use of non-steroidal anti-inflammatories (NSAID): Secondary | ICD-10-CM | POA: Insufficient documentation

## 2014-12-11 LAB — COMPREHENSIVE METABOLIC PANEL
ALT: 14 U/L (ref 0–35)
AST: 11 U/L (ref 0–37)
Albumin: 3 g/dL — ABNORMAL LOW (ref 3.5–5.2)
Alkaline Phosphatase: 82 U/L (ref 39–117)
Anion gap: 7 (ref 5–15)
BILIRUBIN TOTAL: 0.2 mg/dL — AB (ref 0.3–1.2)
BUN: 11 mg/dL (ref 6–23)
CHLORIDE: 105 mmol/L (ref 96–112)
CO2: 26 mmol/L (ref 19–32)
Calcium: 9.1 mg/dL (ref 8.4–10.5)
Creatinine, Ser: 0.66 mg/dL (ref 0.50–1.10)
GFR calc Af Amer: >90 mL/min (ref >90)
GLUCOSE: 98 mg/dL (ref 70–99)
Potassium: 3.6 mmol/L (ref 3.5–5.1)
Sodium: 138 mmol/L (ref 135–145)
Total Protein: 6.7 g/dL (ref 6.0–8.3)

## 2014-12-11 LAB — CBC WITH DIFFERENTIAL/PLATELET
BASOS ABS: 0.1 10*3/uL (ref 0.0–0.1)
Basophils Relative: 1 % (ref 0–1)
Eosinophils Absolute: 0.3 10*3/uL (ref 0.0–0.7)
Eosinophils Relative: 3 % (ref 0–5)
HEMATOCRIT: 35.9 % — AB (ref 36.0–46.0)
HEMOGLOBIN: 12.1 g/dL (ref 12.0–15.0)
LYMPHS PCT: 28 % (ref 12–46)
Lymphs Abs: 2.4 10*3/uL (ref 0.7–4.0)
MCH: 28.2 pg (ref 26.0–34.0)
MCHC: 33.7 g/dL (ref 30.0–36.0)
MCV: 83.7 fL (ref 78.0–100.0)
Monocytes Absolute: 0.6 10*3/uL (ref 0.1–1.0)
Monocytes Relative: 6 % (ref 3–12)
Neutro Abs: 5.4 10*3/uL (ref 1.7–7.7)
Neutrophils Relative %: 62 % (ref 43–77)
Platelets: 422 10*3/uL — ABNORMAL HIGH (ref 150–400)
RBC: 4.29 MIL/uL (ref 3.87–5.11)
RDW: 14 % (ref 11.5–15.5)
WBC: 8.7 10*3/uL (ref 4.0–10.5)

## 2014-12-11 LAB — URINALYSIS, ROUTINE W REFLEX MICROSCOPIC
BILIRUBIN URINE: NEGATIVE
GLUCOSE, UA: NEGATIVE mg/dL
Hgb urine dipstick: NEGATIVE
Ketones, ur: NEGATIVE mg/dL
Leukocytes, UA: NEGATIVE
Nitrite: NEGATIVE
PROTEIN: NEGATIVE mg/dL
Specific Gravity, Urine: 1.024 (ref 1.005–1.030)
UROBILINOGEN UA: 0.2 mg/dL (ref 0.0–1.0)
pH: 5.5 (ref 5.0–8.0)

## 2014-12-11 LAB — POC OCCULT BLOOD, ED: FECAL OCCULT BLD: POSITIVE — AB

## 2014-12-11 LAB — WET PREP, GENITAL
Clue Cells Wet Prep HPF POC: NONE SEEN
Trich, Wet Prep: NONE SEEN
YEAST WET PREP: NONE SEEN

## 2014-12-11 LAB — PREGNANCY, URINE: PREG TEST UR: NEGATIVE

## 2014-12-11 LAB — LIPASE, BLOOD: Lipase: 24 U/L (ref 11–59)

## 2014-12-11 MED ORDER — ONDANSETRON HCL 4 MG/2ML IJ SOLN
4.0000 mg | Freq: Once | INTRAMUSCULAR | Status: AC
  Administered 2014-12-11: 4 mg via INTRAVENOUS
  Filled 2014-12-11: qty 2

## 2014-12-11 MED ORDER — SODIUM CHLORIDE 0.9 % IV BOLUS (SEPSIS)
1000.0000 mL | INTRAVENOUS | Status: AC
  Administered 2014-12-11: 1000 mL via INTRAVENOUS

## 2014-12-11 MED ORDER — TRAMADOL HCL 50 MG PO TABS: 50.0000 mg | ORAL_TABLET | Freq: Four times a day (QID) | ORAL | Status: DC | PRN

## 2014-12-11 MED ORDER — CEPHALEXIN 500 MG PO CAPS: 500.0000 mg | ORAL_CAPSULE | Freq: Four times a day (QID) | ORAL | Status: DC

## 2014-12-11 MED ORDER — KETOROLAC TROMETHAMINE 30 MG/ML IJ SOLN
30.0000 mg | Freq: Once | INTRAMUSCULAR | Status: AC
  Administered 2014-12-11: 30 mg via INTRAVENOUS
  Filled 2014-12-11: qty 1

## 2014-12-11 MED ORDER — DEXTROSE 5 % IV SOLN
2.0000 g | Freq: Once | INTRAVENOUS | Status: AC
  Administered 2014-12-11: 2 g via INTRAVENOUS
  Filled 2014-12-11: qty 2

## 2014-12-11 MED ORDER — IOHEXOL 300 MG/ML  SOLN
25.0000 mL | INTRAMUSCULAR | Status: AC
  Administered 2014-12-11: 25 mL via ORAL

## 2014-12-11 MED ORDER — FENTANYL CITRATE 0.05 MG/ML IJ SOLN
50.0000 ug | Freq: Once | INTRAMUSCULAR | Status: AC
  Administered 2014-12-11: 50 ug via INTRAVENOUS
  Filled 2014-12-11: qty 2

## 2014-12-11 MED ORDER — IOHEXOL 300 MG/ML  SOLN
100.0000 mL | Freq: Once | INTRAMUSCULAR | Status: AC | PRN
  Administered 2014-12-11: 100 mL via INTRAVENOUS

## 2014-12-11 MED ORDER — HYDROMORPHONE HCL 1 MG/ML IJ SOLN
1.0000 mg | Freq: Once | INTRAMUSCULAR | Status: AC
  Administered 2014-12-11: 1 mg via INTRAVENOUS
  Filled 2014-12-11: qty 1

## 2014-12-11 NOTE — ED Notes (Signed)
Pt has finished her po contrast. Called ct and they are aware

## 2014-12-11 NOTE — ED Notes (Signed)
Pt reports lower abd pain that radiates to lower back. Having pain with urination. Reports bright red blood in stools and n/v.

## 2014-12-11 NOTE — ED Provider Notes (Signed)
CSN: [id number]     Arrival date & time 12/11/14  1215 History   First MD Initiated Contact with Patient 12/11/14 1517     Chief Complaint  Patient presents with  . Abdominal Pain  . Back Pain   (Consider location/radiation/quality/duration/timing/severity/associated sxs/prior Treatment) HPI Deanna [name] is a 44 yo female presenting with complaint of lower abdominal pain. She states she has been feeling poorly for a "a while" but recalls she has had decreased appetite, with some nausea, vomiting and diarrhea x 3 weeks. The pain in her abd began to get worse appr 3-4 days ago and she complain of pain across her lower abdomen and lower back and in her rectum. She reports the diarrhea for the last few days had the appearance of blood and pus as it was dark red and yellow. She states this is similar to pain she had when she had a "perianal cyst".  She describes this pain as sharp and constant and worse with bowel movement.  Her lmp was 1 month ago, and she reports she is due soon.  She endorses chills, but denies documented fever, she endorses, pressure with urination but no dysuria.  She endorses cough but is similar to her cough due to smoking.    Past Medical History  Diagnosis Date  . Hypertension   . Asthma   . Depression with anxiety   . Chronic back pain   . DDD (degenerative disc disease), lumbar   . Endometriosis   . Ectopic pregnancy    Past Surgical History  Procedure Laterality Date  . Ectopic pregnancy surgery    . Peri anal cyst    . Ectopic pregnancy surgery    . Dilation and curettage of uterus     History reviewed. No pertinent family history. History  Substance Use Topics  . Smoking status: Current Every Day Smoker -- 2.00 packs/day    Types: Cigarettes  . Smokeless tobacco: Not on file  . Alcohol Use: No   OB History    No data available     Review of Systems  Constitutional: Positive for chills. Negative for fever.  HENT: Negative for sore throat.     Eyes: Negative for visual disturbance.  Respiratory: Positive for cough. Negative for shortness of breath.   Cardiovascular: Negative for chest pain and leg swelling.  Gastrointestinal: Positive for nausea, vomiting, abdominal pain, diarrhea, blood in stool and rectal pain.  Genitourinary: Positive for flank pain. Negative for dysuria and vaginal discharge.  Musculoskeletal: Negative for myalgias.  Skin: Negative for rash.  Neurological: Negative for weakness, numbness and headaches.    Allergies  Erythromycin base and Latex  Home Medications   Prior to Admission medications   Medication Sig Start Date End Date Taking? Authorizing Provider  albuterol (PROVENTIL HFA;VENTOLIN HFA) 108 (90 BASE) MCG/ACT inhaler Inhale 1-2 puffs into the lungs every 6 (six) hours as needed for wheezing or shortness of breath.   Yes Historical Provider, MD  Aspirin-Acetaminophen-Caffeine (GOODYS EXTRA STRENGTH PO) Take 1 packet by mouth 4 (four) times daily.    Yes Historical Provider, MD  diphenhydrAMINE (SOMINEX) 25 MG tablet Take 50 mg by mouth 4 (four) times daily.   Yes Historical Provider, MD  ENSURE PLUS (ENSURE PLUS) LIQD Take 237 mLs by mouth daily.   Yes Historical Provider, MD  ibuprofen (ADVIL,MOTRIN) 200 MG tablet Take 800 mg by mouth every 6 (six) hours.    Yes Historical Provider, MD  amoxicillin-clavulanate (AUGMENTIN) 875-125 MG per tablet Take  1 tablet by mouth every 12 (twelve) hours. 08/25/14   Hilario Quarry, MD  diazepam (VALIUM) 5 MG tablet Take 1 tablet (5 mg total) by mouth 2 (two) times daily. 10/23/13   Hope Orlene Och, NP  meloxicam (MOBIC) 7.5 MG tablet Take 1 tablet (7.5 mg total) by mouth daily. 10/23/13   Hope Orlene Och, NP  oxyCODONE-acetaminophen (PERCOCET/ROXICET) 5-325 MG per tablet Take 1 tablet by mouth every 4 (four) hours as needed for severe pain. 08/25/14   Hilario Quarry, MD   BP 155/101 mmHg  Pulse 78  Temp(Src) 98.4 F (36.9 C) (Oral)  Resp 14  SpO2 96%  LMP  11/10/2014 Physical Exam  Constitutional: She appears well-developed and well-nourished. No distress.  HENT:  Head: Normocephalic and atraumatic.  Mouth/Throat: Oropharynx is clear and moist. No oropharyngeal exudate.  Eyes: Conjunctivae are normal.  Neck: Neck supple. No thyromegaly present.  Cardiovascular: Normal rate, regular rhythm and intact distal pulses.   Pulmonary/Chest: Effort normal. No respiratory distress. She has no decreased breath sounds. She has wheezes ( mild) in the right middle field and the left middle field. She has no rhonchi. She has no rales. She exhibits no tenderness.  Abdominal: Soft. She exhibits no distension and no mass. There is generalized tenderness. There is no rigidity, no rebound, no guarding, no CVA tenderness, no tenderness at McBurney's point and negative Murphy's sign.    Genitourinary: Rectal exam shows no external hemorrhoid, no internal hemorrhoid, no fissure, no mass and no tenderness. Guaiac positive stool. Pelvic exam was performed with patient supine. There is no tenderness on the right labia. There is no tenderness on the left labia. Cervix exhibits no motion tenderness, no discharge and no friability. Right adnexum displays no tenderness. Left adnexum displays no tenderness. No vaginal discharge found.    Musculoskeletal: She exhibits no tenderness.  Lymphadenopathy:    She has no cervical adenopathy.  Neurological: She is alert.  Skin: Skin is warm and dry. No rash noted. She is not diaphoretic.  Psychiatric: She has a normal mood and affect.  Nursing note and vitals reviewed.   ED Course  Procedures (including critical care time) Labs Review Labs Reviewed  WET PREP, GENITAL - Abnormal; Notable for the following:    WBC, Wet Prep HPF POC FEW (*)    All other components within normal limits  CBC WITH DIFFERENTIAL/PLATELET - Abnormal; Notable for the following:    HCT 35.9 (*)    Platelets 422 (*)    All other components within  normal limits  COMPREHENSIVE METABOLIC PANEL - Abnormal; Notable for the following:    Albumin 3.0 (*)    Total Bilirubin 0.2 (*)    All other components within normal limits  POC OCCULT BLOOD, ED - Abnormal; Notable for the following:    Fecal Occult Bld POSITIVE (*)    All other components within normal limits  URINE CULTURE  LIPASE, BLOOD  URINALYSIS, ROUTINE W REFLEX MICROSCOPIC  PREGNANCY, URINE  GC/CHLAMYDIA PROBE AMP ([place])    Imaging Review Ct Abdomen Pelvis W Contrast  12/11/2014   CLINICAL DATA:  Abdominal pain with hematochezia. Initial encounter. Periumbilical abdominal pain.  EXAM: CT ABDOMEN AND PELVIS WITH CONTRAST  TECHNIQUE: Multidetector CT imaging of the abdomen and pelvis was performed using the standard protocol following bolus administration of intravenous contrast.  CONTRAST:  25mL OMNIPAQUE IOHEXOL 300 MG/ML SOLN, OMNIPAQUE IOHEXOL 300 MG/ML SOLN  COMPARISON:  07/02/2013.  FINDINGS: Musculoskeletal: L5-S1 predominant spondylosis with  degenerative sclerotic endplate changes. No aggressive osseous lesions. Calcific tendinitis of the RIGHT common hamstring origins.  Lung Bases: Scattered areas of mosaic attenuation at the bases, suggesting small areas of air trapping.  Liver: Mild fatty infiltration adjacent to the falciform ligament of the liver.  Spleen:  Normal.  Gallbladder:  Normal.  Common bile duct:  Normal.  Pancreas:  Normal.  Adrenal glands:  Normal.  Kidneys: Decreased cortical perfusion in the RIGHT upper pole and small areas in the interpolar region. This extends the RIGHT inferior pole as well. Similar area is present in the LEFT upper pole. Both ureters appear within normal limits. Renal delayed images also show striated nephrogram.  Stomach:  Normal.  Small bowel:  Within normal limits.  Colon: Normal appendix. Colon appears within normal limits. Descending colon is decompressed.  Pelvic Genitourinary: Within normal limits. Urinary bladder grossly  appears normal.  Peritoneum: Normal.  Vasculature: Normal.  Body Wall: Normal.  IMPRESSION: 1. Normal appearance of the colon and rectum. No inflammatory changes to suggest a cause for hematochezia. 2. Striated nephrograms, greater on the RIGHT than LEFT most compatible with pyelonephritis.   Electronically Signed   By: Andreas NewportGeoffrey  Lamke M.D.   On: 12/11/2014 19:26     EKG Interpretation None      MDM   Final diagnoses:  Abdominal pain  Pyelonephritis   44 yo with persistent abd pain with report of n/v/d. NS bolus, toradol, zofran. CBC, CMP, Lipase, UA, Upreg, Pelvic exam and hemoccult.  Discussed case with Dr. Rosalia Hammersay.  CT abd/pelvis ordered.  Labs reviewed: despite no gross blood on exam, hemoccult is positive. No other significant abnormalities.   CT abd/pelvis resulted: Showing a normal appearance of the colon and rectum and no inflammatory changes to suggest a cause for hematochezia. Noted striated nephrograms compatible with pyelonephritis. Discussed findings with pt, pain improved in the ED, IV rocephin 2 gm given.  She is nontoxic, nonseptic appearing, and in no apparent distress.  On repeat exam patient does not have a surgical abdomen or signs of peritonitis.  She appears safe to be discharged. Vital signs reviewed and not concerning. Discharge include resources to follow-up with their PCP and prescription for keflex.  I have also discussed reasons to return immediately to the ER.  Patient expresses understanding and agrees with plan.   Filed Vitals:   12/11/14 2000 12/11/14 2030 12/11/14 2100 12/11/14 2115  BP: 146/84 139/79 154/88   Pulse: 74 72 75 77  Temp:      TempSrc:      Resp: 16 13 12 10   SpO2: 96% 98% 97% 97%   Meds given in ED:  Medications  sodium chloride 0.9 % bolus 1,000 mL (0 mLs Intravenous Stopped 12/11/14 1707)  ondansetron (ZOFRAN) injection 4 mg (4 mg Intravenous Given 12/11/14 1617)  ketorolac (TORADOL) 30 MG/ML injection 30 mg (30 mg Intravenous Given  12/11/14 1617)  fentaNYL (SUBLIMAZE) injection 50 mcg (50 mcg Intravenous Given 12/11/14 1705)  iohexol (OMNIPAQUE) 300 MG/ML solution 25 mL (25 mLs Oral Contrast Given 12/11/14 1802)  HYDROmorphone (DILAUDID) injection 1 mg (1 mg Intravenous Given 12/11/14 1835)  ondansetron (ZOFRAN) injection 4 mg (4 mg Intravenous Given 12/11/14 1835)  iohexol (OMNIPAQUE) 300 MG/ML solution 100 mL (100 mLs Intravenous Contrast Given 12/11/14 1848)  cefTRIAXone (ROCEPHIN) 2 g in dextrose 5 % 50 mL IVPB (0 g Intravenous Stopped 12/11/14 2116)  ketorolac (TORADOL) 30 MG/ML injection 30 mg (30 mg Intravenous Given 12/11/14 2123)    Discharge Medication  List as of 12/11/2014  9:27 PM    START taking these medications   Details  cephALEXin (KEFLEX) 500 MG capsule Take 1 capsule (500 mg total) by mouth 4 (four) times daily., Starting 12/11/2014, Until Discontinued, Print    traMADol (ULTRAM) 50 MG tablet Take 1 tablet (50 mg total) by mouth every 6 (six) hours as needed., Starting 12/11/2014, Until Discontinued, Print           Harle Battiest, NP 12/12/14 1301  Hilario Quarry, MD 12/12/14 1556

## 2014-12-11 NOTE — ED Notes (Signed)
Pelvic cart set up at bedside  

## 2014-12-11 NOTE — Discharge Instructions (Signed)
Please follow the directions provided. Use the referrals given or the resource guide below to establish care with a primary care doctor for follow-up on this abdominal pain. Take the antibiotic, Keflex as directed. You may use Tylenol or ibuprofen for pain. You may use the Ultram for pain not relieved by Tylenol or ibuprofen. Be sure to drink plenty of fluid. Don't hesitate to return for any new, worsening, or concerning symptoms.   SEEK IMMEDIATE MEDICAL CARE IF:  You have a fever or persistent symptoms for more than 2-3 days.  You have a fever and your symptoms suddenly get worse.  You are unable to take your antibiotics or fluids.  You develop shaking chills.  You experience extreme weakness or fainting.  There is no improvement after 2 days of treatment.    Emergency Department Resource Guide 1) Find a Doctor and Pay Out of Pocket Although you won't have to find out who is covered by your insurance plan, it is a good idea to ask around and get recommendations. You will then need to call the office and see if the doctor you have chosen will accept you as a new patient and what types of options they offer for patients who are self-pay. Some doctors offer discounts or will set up payment plans for their patients who do not have insurance, but you will need to ask so you aren't surprised when you get to your appointment.  2) Contact Your Local Health Department Not all health departments have doctors that can see patients for sick visits, but many do, so it is worth a call to see if yours does. If you don't know where your local health department is, you can check in your phone book. The CDC also has a tool to help you locate your state's health department, and many state websites also have listings of all of their local health departments.  3) Find a Walk-in Clinic If your illness is not likely to be very severe or complicated, you may want to try a walk in clinic. These are popping up all over  the country in pharmacies, drugstores, and shopping centers. They're usually staffed by nurse practitioners or physician assistants that have been trained to treat common illnesses and complaints. They're usually fairly quick and inexpensive. However, if you have serious medical issues or chronic medical problems, these are probably not your best option.  No Primary Care Doctor: - Call Health Connect at  [phone] - they can help you locate a primary care doctor that  accepts your insurance, provides certain services, etc. - Physician Referral Service- [phone]  Chronic Pain Problems: Organization         Address  Phone   Notes  [facility]  [phone]) [phone] Patients need to be referred by their primary care doctor.   Medication Assistance: Organization         Address  Phone   Notes  [facility] Medication [facility] [address] [place], [place] [address] [phone]) [phone] --Must be a resident of [facility] [place] -- Must have NO insurance coverage whatsoever (no Medicaid/ Medicare, etc.) -- The pt. MUST have a primary care doctor that directs their care regularly and follows them in the community   MedAssist  [phone]   [employer]  [phone]    Agencies that provide inexpensive medical care: Organization         Address  Phone   Notes  [facility]  (  [phone]   [facility]    [phone]) [phone]   [facility] [address] [place], [place] [address] [phone]) [phone]   [facility] [place] [address], [place] [phone]) [phone]   Planned Parenthood    [phone]) [phone]   [facility]    [phone](336) [phone]   Community Health and [facility]  [address], [place] Phone:  [phone](336) [phone], Fax:  [phone]) [phone] Hours of Operation:  9 am - 6 pm, M-F.  Also accepts Medicaid/Medicare and self-pay.  [facility]  [address],  [address], [place] Phone: [phone]) [phone], Fax: [phone](336) [phone]. Hours of Operation:  8:30 am - 5:30 pm, M-F.  Also accepts Medicaid and self-pay.  [facility] [place] [address], [place] Phone: [phone]) [phone]   [facility] [address], [place], [place] [phone], Ext. [phone] Mondays & Thursdays: 7-9 AM.  First 15 patients are seen on a first come, first serve basis.    Medicaid-accepting [facility] Providers:  Organization         Address  Phone   Notes  [facility] [address], [place] [phone]) [phone] Also accepts self-pay patients.  [facility] [address], [place]  [phone](336) [phone]   [facility] [address], [place] [phone]) [phone]   [facility] [address] [place] [address], [place] [phone]) [phone]   Renaye RakersVeita Bland [address], [place]   [phone]) [phone] Only accepts [place] Access [place] patients after they have their name applied to their card.   Self-Pay (no insurance) in [facility]:  Organization         Address  Phone   Notes  Sickle Cell Patients, [facility] [address], [place] [phone]) [phone]   [facility] [address], [place] [phone]) [phone]   [facility]  [address], [place] [phone]) [phone]   [facility]/Dr. Osei-Bonsu  [address] [place] [address], [place] or [phone], [place] [phone](336) [phone] Phone number for both [place] and [place] locations is the same.  [facility] [address], [place] [phone]) [phone]   [facility]  [address] [place] [address], [place] or [address] [phone]) [phone]   [facility] [address], [place] [phone]) [phone], phone; [phone]) [phone], fax Sees patients 1st and 3rd Saturday of every month.   Must not qualify for public or private insurance (i.e. Medicaid, Medicare, [place] Health Choice, Veterans' Benefits)  Household income should be no more than 200% of the poverty level The clinic cannot treat you if you are pregnant or think you are pregnant  Sexually transmitted diseases are not treated at the clinic.    Dental Care: Organization         Address  Phone  Notes  [facility] Department of [facility] [address], [place] [phone]) [phone] Accepts children up to age 44 who are enrolled in [place] or [place] Health Choice; pregnant women with a Medicaid card; and children who have applied for Medicaid or  Health Choice, but were declined, whose parents can pay a reduced fee at time of service.  [facility] Department of [facility] [place]  [address], [employer] [phone] Accepts children up to age 34 who are enrolled in Medicaid or [place]; pregnant women with a Medicaid card; and children who have applied for Medicaid or  Health Choice, but were declined, whose parents can pay a reduced fee at time of service.  [facility]  University [phone] Patients are seen by appointment only. Walk-ins are not accepted. [place] will see patients 52 years of age and older. Monday - Tuesday (8am-5pm) Most Wednesdays (8:30-5pm) $30 per visit, cash only  [facility] Access PROGRAM  [address], [facility] [phone] Patients are seen by appointment only. Walk-ins are not accepted. [place] will see patients 15 years of age and older. One Wednesday Evening (Monthly: Volunteer Based).  $30 per visit, cash only  [place]  [phone] for adults; Children under age 68, call Graduate Pediatric Dentistry at [phone]. Children aged 14-14, please call [phone] to request a pediatric application.  Dental services are  provided in all areas of dental care including fillings, crowns and bridges, complete and partial dentures, implants, gum treatment, root canals, and extractions. Preventive care is also provided. Treatment is provided to both adults and children. Patients are selected via a lottery and there is often a waiting list.   [facility] [address] [place]  [phone] [email or web address]   [facility] [address] [place], [place] [phone], Ext. [phone] Second and Fourth Thursday of each month, opens at 6:30 AM; Clinic ends at 9 AM.  Patients are seen on a first-come first-served basis, and a limited number are seen during each clinic.   [facility]  [address] Hillard D[place]er [place], [place] [phone]   Eligibility Requirements You must have lived in [place], [place], or [place] counties for at least the last three months.   You cannot be eligible for state or federal sponsored [employer], including [employer], [place], or [employer].   You generally cannot be eligible for healthcare insurance through your employer.    How to apply: Eligibility screenings are held every Tuesday and Wednesday afternoon from 1:00 pm until 4:00 pm. You do not need an appointment for the interview!  [facility] [address], [place], [place]   [place] Department  [place]  [phone]    Behavioral Health Resources in the Community: Intensive Outpatient Programs Organization         Address  Phone  Notes  [place]. [address] [place], [place] [phone]   [facility] Outpatient [address], [place], [place]   ADS: Alcohol & Drug Svcs [address], [place], [place]   [place] [address]  [place], [place] or [phone]   Substance Abuse Resources Organization         Address  Phone  Notes  Alcohol and Drug Services  [phone]   [place]  [phone]   The [place]   Chinita Pester  [phone]   Residential & Outpatient Substance Abuse Program  [phone]   Psychological Services Organization         Address  Phone  Notes  [place]  [phone]  [facility]  [phone]- [phone]   [facility] [address], [place] [phone] or [phone]    Mobile Crisis Teams Organization         Address  Phone  Notes  [facility], Mobile Crisis Care Unit  [phone]   [facility]  [address] [place], [place] [phone]   Doristine LocksSharon DeEsch [address] [place] [phone]    Self-Help/Support Groups Organization         Address  Phone             Notes  [facility] [place] - variety of support groups  [phone]- [phone] Call for more information  Narcotics Anonymous (NA), Caring [facility] [address], [place] Kenneth  2 meetings at this location   Statisticianesidential Treatment Programs Organization         Address  Phone  Notes  [facility] [address],    [place]  [phone]   [facility]  [address], [place] [phone], [place], [place] [phone]   [facility] [address], [place] [phone] Admissions: 8am-3pm M-F  [facility] [address]    [place], [place] [phone]   [facility] [address], [place], [place] [phone]   [facility] [address]  [place], [place] [phone]   [facility] - Intensive Outpatient [address], [place], [place] [phone]   [facility] ([facility] [address]  [place], [place] [phone] or  [phone]   [facility] (RTS) [address] [place], [place] [phone] Accepts Medicaid  [facility] [address]  [place] [phone] Substance Abuse/Addiction Treatment   [facility] [facility]al Health Resources Organization         Address  Phone  Notes  [facility]  [phone]) [phone]   Angie FavaJulie Brannon, PhD [address], [address] [place], [place]   [phone](336) [phone] or [phone](336) [phone]   [facility]   [address] [place], [place] [phone](336) [phone]   [facility] [address], [place], [place] [phone](336) [phone] Insurance/Medicaid/sponsorship through [facility]  Faith and Families [address]                                    [place], [place] [phone](336) [phone] Therapy/tele-psych/case  [facility] [address].   [place], [place] [phone](336) [phone]    Dr. Lolly MustacheArfeen  [phone](336) [phone]   [facility] [place]  [employer] [facility] 1) [address], [place] 2) [address], [place] 3)  [address], [place] [phone](336) [phone](336) [phone]  [phone](336) [phone]   [facility] Child Abuse Hotline [phone](336) [phone] or [phone](336) [phone] (After Hours)

## 2014-12-13 LAB — URINE CULTURE
COLONY COUNT: NO GROWTH
Culture: NO GROWTH
Special Requests: NORMAL

## 2014-12-13 LAB — GC/CHLAMYDIA PROBE AMP (~~LOC~~) NOT AT ARMC
Chlamydia: NEGATIVE
NEISSERIA GONORRHEA: NEGATIVE

## 2015-12-16 ENCOUNTER — Emergency Department (HOSPITAL_BASED_OUTPATIENT_CLINIC_OR_DEPARTMENT_OTHER): Payer: PRIVATE HEALTH INSURANCE

## 2015-12-16 ENCOUNTER — Encounter (HOSPITAL_BASED_OUTPATIENT_CLINIC_OR_DEPARTMENT_OTHER): Payer: Self-pay | Admitting: Emergency Medicine

## 2015-12-16 ENCOUNTER — Inpatient Hospital Stay (HOSPITAL_BASED_OUTPATIENT_CLINIC_OR_DEPARTMENT_OTHER)
Admission: EM | Admit: 2015-12-16 | Discharge: 2015-12-18 | DRG: 378 | Disposition: A | Discharge status: Home / Self Care | Payer: PRIVATE HEALTH INSURANCE | Attending: Internal Medicine | Admitting: Internal Medicine

## 2015-12-16 DIAGNOSIS — F332 Major depressive disorder, recurrent severe without psychotic features: Secondary | ICD-10-CM | POA: Diagnosis present

## 2015-12-16 DIAGNOSIS — R109 Unspecified abdominal pain: Secondary | ICD-10-CM

## 2015-12-16 DIAGNOSIS — F418 Other specified anxiety disorders: Secondary | ICD-10-CM | POA: Diagnosis present

## 2015-12-16 DIAGNOSIS — J4520 Mild intermittent asthma, uncomplicated: Secondary | ICD-10-CM

## 2015-12-16 DIAGNOSIS — N946 Dysmenorrhea, unspecified: Secondary | ICD-10-CM | POA: Diagnosis present

## 2015-12-16 DIAGNOSIS — I10 Essential (primary) hypertension: Secondary | ICD-10-CM | POA: Diagnosis present

## 2015-12-16 DIAGNOSIS — K529 Noninfective gastroenteritis and colitis, unspecified: Secondary | ICD-10-CM

## 2015-12-16 DIAGNOSIS — K254 Chronic or unspecified gastric ulcer with hemorrhage: Secondary | ICD-10-CM | POA: Diagnosis not present

## 2015-12-16 DIAGNOSIS — K922 Gastrointestinal hemorrhage, unspecified: Secondary | ICD-10-CM | POA: Diagnosis present

## 2015-12-16 DIAGNOSIS — Z833 Family history of diabetes mellitus: Secondary | ICD-10-CM

## 2015-12-16 DIAGNOSIS — F17200 Nicotine dependence, unspecified, uncomplicated: Secondary | ICD-10-CM | POA: Diagnosis present

## 2015-12-16 DIAGNOSIS — T7491XA Unspecified adult maltreatment, confirmed, initial encounter: Secondary | ICD-10-CM | POA: Diagnosis present

## 2015-12-16 DIAGNOSIS — D62 Acute posthemorrhagic anemia: Secondary | ICD-10-CM | POA: Diagnosis present

## 2015-12-16 DIAGNOSIS — G8929 Other chronic pain: Secondary | ICD-10-CM | POA: Diagnosis present

## 2015-12-16 DIAGNOSIS — Z91410 Personal history of adult physical and sexual abuse: Secondary | ICD-10-CM

## 2015-12-16 DIAGNOSIS — F17210 Nicotine dependence, cigarettes, uncomplicated: Secondary | ICD-10-CM | POA: Diagnosis present

## 2015-12-16 DIAGNOSIS — M5136 Other intervertebral disc degeneration, lumbar region: Secondary | ICD-10-CM

## 2015-12-16 DIAGNOSIS — H538 Other visual disturbances: Secondary | ICD-10-CM | POA: Diagnosis present

## 2015-12-16 DIAGNOSIS — Z8249 Family history of ischemic heart disease and other diseases of the circulatory system: Secondary | ICD-10-CM

## 2015-12-16 DIAGNOSIS — M51369 Other intervertebral disc degeneration, lumbar region without mention of lumbar back pain or lower extremity pain: Secondary | ICD-10-CM | POA: Diagnosis present

## 2015-12-16 DIAGNOSIS — J45909 Unspecified asthma, uncomplicated: Secondary | ICD-10-CM | POA: Diagnosis present

## 2015-12-16 LAB — COMPREHENSIVE METABOLIC PANEL
ALBUMIN: 3.3 g/dL — AB (ref 3.5–5.0)
ALT: 11 U/L — ABNORMAL LOW (ref 14–54)
ANION GAP: 7 (ref 5–15)
AST: 11 U/L — ABNORMAL LOW (ref 15–41)
Alkaline Phosphatase: 64 U/L (ref 38–126)
BILIRUBIN TOTAL: 0.2 mg/dL — AB (ref 0.3–1.2)
BUN: 10 mg/dL (ref 6–20)
CO2: 24 mmol/L (ref 22–32)
Calcium: 8.3 mg/dL — ABNORMAL LOW (ref 8.9–10.3)
Chloride: 107 mmol/L (ref 101–111)
Creatinine, Ser: 0.57 mg/dL (ref 0.44–1.00)
GLUCOSE: 88 mg/dL (ref 65–99)
Potassium: 3.8 mmol/L (ref 3.5–5.1)
SODIUM: 138 mmol/L (ref 135–145)
TOTAL PROTEIN: 6.6 g/dL (ref 6.5–8.1)

## 2015-12-16 LAB — CBC
HCT: 25.3 % — ABNORMAL LOW (ref 36.0–46.0)
HEMOGLOBIN: 7.7 g/dL — AB (ref 12.0–15.0)
MCH: 25.9 pg — ABNORMAL LOW (ref 26.0–34.0)
MCHC: 30.4 g/dL (ref 30.0–36.0)
MCV: 85.2 fL (ref 78.0–100.0)
Platelets: 518 10*3/uL — ABNORMAL HIGH (ref 150–400)
RBC: 2.97 MIL/uL — ABNORMAL LOW (ref 3.87–5.11)
RDW: 16.2 % — AB (ref 11.5–15.5)
WBC: 10.4 10*3/uL (ref 4.0–10.5)

## 2015-12-16 LAB — HEMATOCRIT: HEMATOCRIT: 21.6 % — AB (ref 36.0–46.0)

## 2015-12-16 LAB — PREPARE RBC (CROSSMATCH)

## 2015-12-16 LAB — OCCULT BLOOD X 1 CARD TO LAB, STOOL: Fecal Occult Bld: POSITIVE — AB

## 2015-12-16 LAB — HEMOGLOBIN: Hemoglobin: 6.9 g/dL — CL (ref 12.0–15.0)

## 2015-12-16 MED ORDER — SODIUM CHLORIDE 0.9 % IV SOLN
INTRAVENOUS | Status: DC
  Administered 2015-12-16: 15:00:00 via INTRAVENOUS

## 2015-12-16 MED ORDER — MORPHINE SULFATE (PF) 4 MG/ML IV SOLN
4.0000 mg | Freq: Once | INTRAVENOUS | Status: AC
  Administered 2015-12-16: 4 mg via INTRAVENOUS
  Filled 2015-12-16: qty 1

## 2015-12-16 MED ORDER — ONDANSETRON HCL 4 MG/2ML IJ SOLN
4.0000 mg | Freq: Four times a day (QID) | INTRAMUSCULAR | Status: DC | PRN
  Administered 2015-12-16 – 2015-12-18 (×6): 4 mg via INTRAVENOUS
  Filled 2015-12-16 (×6): qty 2

## 2015-12-16 MED ORDER — PANTOPRAZOLE SODIUM 40 MG IV SOLR
INTRAVENOUS | Status: AC
  Filled 2015-12-16: qty 80

## 2015-12-16 MED ORDER — MORPHINE SULFATE (PF) 4 MG/ML IV SOLN
4.0000 mg | Freq: Once | INTRAVENOUS | Status: DC
Start: 2015-12-16 — End: 2015-12-16
  Filled 2015-12-16: qty 1

## 2015-12-16 MED ORDER — ONDANSETRON HCL 4 MG PO TABS: 4.0000 mg | ORAL_TABLET | Freq: Four times a day (QID) | ORAL | Status: DC | PRN

## 2015-12-16 MED ORDER — PANTOPRAZOLE SODIUM 40 MG IV SOLR: 40.0000 mg | Freq: Two times a day (BID) | INTRAVENOUS | Status: DC

## 2015-12-16 MED ORDER — ALBUTEROL SULFATE (2.5 MG/3ML) 0.083% IN NEBU
3.0000 mL | INHALATION_SOLUTION | Freq: Four times a day (QID) | RESPIRATORY_TRACT | Status: DC | PRN
Start: 2015-12-16 — End: 2015-12-18
  Administered 2015-12-17: 3 mL via RESPIRATORY_TRACT

## 2015-12-16 MED ORDER — ACETAMINOPHEN 325 MG PO TABS: 650.0000 mg | ORAL_TABLET | Freq: Four times a day (QID) | ORAL | Status: DC | PRN

## 2015-12-16 MED ORDER — OXYCODONE-ACETAMINOPHEN 5-325 MG PO TABS
1.0000 | ORAL_TABLET | ORAL | Status: DC | PRN
  Administered 2015-12-16 (×2): 1 via ORAL
  Filled 2015-12-16 (×2): qty 1

## 2015-12-16 MED ORDER — ACETAMINOPHEN 650 MG RE SUPP: 650.0000 mg | Freq: Four times a day (QID) | RECTAL | Status: DC | PRN

## 2015-12-16 MED ORDER — MORPHINE SULFATE (PF) 4 MG/ML IV SOLN
4.0000 mg | INTRAVENOUS | Status: DC | PRN
  Administered 2015-12-16 – 2015-12-18 (×8): 4 mg via INTRAVENOUS
  Filled 2015-12-16 (×8): qty 1

## 2015-12-16 MED ORDER — MORPHINE SULFATE (PF) 4 MG/ML IV SOLN
4.0000 mg | Freq: Once | INTRAVENOUS | Status: AC
  Administered 2015-12-16: 4 mg via INTRAVENOUS

## 2015-12-16 MED ORDER — SODIUM CHLORIDE 0.9 % IV SOLN
Freq: Once | INTRAVENOUS | Status: AC
  Administered 2015-12-16: 20:00:00 via INTRAVENOUS

## 2015-12-16 MED ORDER — NICOTINE 21 MG/24HR TD PT24
MEDICATED_PATCH | TRANSDERMAL | Status: AC
  Administered 2015-12-16: 7 mg
  Filled 2015-12-16: qty 1

## 2015-12-16 MED ORDER — PANTOPRAZOLE SODIUM 40 MG IV SOLR
40.0000 mg | Freq: Two times a day (BID) | INTRAVENOUS | Status: DC
  Administered 2015-12-17: 40 mg via INTRAVENOUS

## 2015-12-16 MED ORDER — DIAZEPAM 5 MG PO TABS
5.0000 mg | ORAL_TABLET | Freq: Two times a day (BID) | ORAL | Status: DC
  Administered 2015-12-16 – 2015-12-18 (×4): 5 mg via ORAL
  Filled 2015-12-16 (×4): qty 1

## 2015-12-16 MED ORDER — LORAZEPAM 2 MG/ML IJ SOLN
0.5000 mg | Freq: Once | INTRAMUSCULAR | Status: AC
  Administered 2015-12-16: 0.5 mg via INTRAVENOUS
  Filled 2015-12-16: qty 1

## 2015-12-16 MED ORDER — PANTOPRAZOLE SODIUM 40 MG IV SOLR
80.0000 mg | Freq: Once | INTRAVENOUS | Status: AC
  Administered 2015-12-16: 80 mg via INTRAVENOUS

## 2015-12-16 MED ORDER — NICOTINE 7 MG/24HR TD PT24
7.0000 mg | MEDICATED_PATCH | Freq: Once | TRANSDERMAL | Status: AC
  Administered 2015-12-17: 7 mg via TRANSDERMAL
  Filled 2015-12-16 (×2): qty 1

## 2015-12-16 MED ORDER — SODIUM CHLORIDE 0.9 % IV SOLN
INTRAVENOUS | Status: DC
  Administered 2015-12-18: 1000 mL via INTRAVENOUS
  Administered 2015-12-18: via INTRAVENOUS
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
## 2015-12-16 NOTE — Progress Notes (Addendum)
Hgb has dropped to 6.9. I discussed need for blood transfusion at bedside with patient including possible complications and she wants to proceed. Deanna [name] stated that she was still having some pain in her abdomen and persistent diffuse headache, she had just received oxycodone. Will give small dose of IV morphine if continues to c/o pain after oxycodone should have taken effect.  She also mentioned to me that she was having blurred vision in the periphery of her right eye since the morning. She has had some bilateral hand tingling which is now resolved, but no unilateral numbness, focal weakness, or other neurological signs. Gross eye exam normal. No pain in globe. I spoke with Dr. Robb Matar with [facility] and he suggested running these symptoms by ophthalmology. Dr. Genia Del paged and awaiting call back.   Addendum 9:47pm Spoke with Dr. Genia Del, stated that there could potentially be central retinal artery occlusion in the setting of anemia, but nothing needed to be done urgently. He will evaluate patient tomorrow.

## 2015-12-16 NOTE — Patient Care Conference (Signed)
Dr Matthias Hughs called concerning patient eating clear liquid tray that was delivered to her before we were able to place NPO sign on door. Dr Matthias Hughs stated he would wait and preform Endoscopy in the AM. Will continue to monitor.
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
## 2015-12-16 NOTE — Progress Notes (Signed)
CRITICAL VALUE ALERT  Critical value received:  Hemoglobin 6.9  Date of notification:  12/16/15  Time of notification:  1925  Critical value read back:Yes.    Nurse who received alert:  Mick Sell RN   MD notified (1st page):  L. Harduk  Time of first page:  1927  MD notified (2nd page):  Time of second page:  Responding MD:  Wyn Forster  Time MD responded:

## 2015-12-16 NOTE — ED Notes (Signed)
Pt called out c/o increased pain, RN notified.
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
## 2015-12-17 ENCOUNTER — Inpatient Hospital Stay (HOSPITAL_COMMUNITY): Payer: PRIVATE HEALTH INSURANCE

## 2015-12-17 ENCOUNTER — Inpatient Hospital Stay (HOSPITAL_COMMUNITY): Payer: PRIVATE HEALTH INSURANCE | Admitting: Anesthesiology

## 2015-12-17 ENCOUNTER — Encounter (HOSPITAL_COMMUNITY): Payer: Self-pay

## 2015-12-17 ENCOUNTER — Encounter (HOSPITAL_COMMUNITY)
Admission: EM | Disposition: A | Discharge status: Home / Self Care | Payer: Self-pay | Source: Home / Self Care | Attending: Internal Medicine

## 2015-12-17 DIAGNOSIS — F418 Other specified anxiety disorders: Secondary | ICD-10-CM

## 2015-12-17 DIAGNOSIS — Z720 Tobacco use: Secondary | ICD-10-CM | POA: Diagnosis not present

## 2015-12-17 DIAGNOSIS — D62 Acute posthemorrhagic anemia: Secondary | ICD-10-CM

## 2015-12-17 DIAGNOSIS — T7491XA Unspecified adult maltreatment, confirmed, initial encounter: Secondary | ICD-10-CM | POA: Diagnosis not present

## 2015-12-17 DIAGNOSIS — H538 Other visual disturbances: Secondary | ICD-10-CM | POA: Diagnosis present

## 2015-12-17 DIAGNOSIS — K922 Gastrointestinal hemorrhage, unspecified: Secondary | ICD-10-CM

## 2015-12-17 HISTORY — PX: ESOPHAGOGASTRODUODENOSCOPY (EGD) WITH PROPOFOL: SHX5813

## 2015-12-17 LAB — URINALYSIS, ROUTINE W REFLEX MICROSCOPIC
Bilirubin Urine: NEGATIVE
Bilirubin Urine: NEGATIVE
GLUCOSE, UA: NEGATIVE mg/dL
GLUCOSE, UA: NEGATIVE mg/dL
HGB URINE DIPSTICK: NEGATIVE
Hgb urine dipstick: NEGATIVE
KETONES UR: NEGATIVE mg/dL
Ketones, ur: NEGATIVE mg/dL
Nitrite: NEGATIVE
Nitrite: NEGATIVE
PH: 7 (ref 5.0–8.0)
PROTEIN: NEGATIVE mg/dL
Protein, ur: NEGATIVE mg/dL
SPECIFIC GRAVITY, URINE: 1.009 (ref 1.005–1.030)
Specific Gravity, Urine: 1.017 (ref 1.005–1.030)
pH: 6.5 (ref 5.0–8.0)

## 2015-12-17 LAB — URINE MICROSCOPIC-ADD ON: RBC / HPF: NONE SEEN RBC/hpf (ref 0–5)

## 2015-12-17 LAB — HEMOGLOBIN: HEMOGLOBIN: 9.1 g/dL — AB (ref 12.0–15.0)

## 2015-12-17 LAB — RAPID URINE DRUG SCREEN, HOSP PERFORMED
Amphetamines: NOT DETECTED
BARBITURATES: NOT DETECTED
BENZODIAZEPINES: POSITIVE — AB
COCAINE: NOT DETECTED
Opiates: POSITIVE — AB
Tetrahydrocannabinol: POSITIVE — AB

## 2015-12-17 LAB — HEMATOCRIT: HCT: 27.7 % — ABNORMAL LOW (ref 36.0–46.0)

## 2015-12-17 LAB — ABO/RH: ABO/RH(D): A POS

## 2015-12-17 SURGERY — ESOPHAGOGASTRODUODENOSCOPY (EGD) WITH PROPOFOL
Anesthesia: Monitor Anesthesia Care

## 2015-12-17 MED ORDER — LACTATED RINGERS IV SOLN
INTRAVENOUS | Status: DC | PRN
  Administered 2015-12-17: 11:00:00 via INTRAVENOUS

## 2015-12-17 MED ORDER — ALBUTEROL SULFATE (2.5 MG/3ML) 0.083% IN NEBU
INHALATION_SOLUTION | RESPIRATORY_TRACT | Status: AC
  Filled 2015-12-17: qty 3

## 2015-12-17 MED ORDER — IOHEXOL 300 MG/ML  SOLN
25.0000 mL | INTRAMUSCULAR | Status: AC
  Administered 2015-12-17: 25 mL via ORAL

## 2015-12-17 MED ORDER — PANTOPRAZOLE SODIUM 40 MG PO TBEC
40.0000 mg | DELAYED_RELEASE_TABLET | Freq: Two times a day (BID) | ORAL | Status: DC
  Administered 2015-12-17 – 2015-12-18 (×2): 40 mg via ORAL
  Filled 2015-12-17 (×2): qty 1

## 2015-12-17 MED ORDER — PROPOFOL 10 MG/ML IV BOLUS
INTRAVENOUS | Status: DC | PRN
  Administered 2015-12-17: 30 mg via INTRAVENOUS
  Administered 2015-12-17 (×2): 20 mg via INTRAVENOUS
  Administered 2015-12-17: 30 mg via INTRAVENOUS
  Administered 2015-12-17: 20 mg via INTRAVENOUS
  Administered 2015-12-17 (×2): 30 mg via INTRAVENOUS
  Administered 2015-12-17 (×2): 20 mg via INTRAVENOUS
  Administered 2015-12-17: 30 mg via INTRAVENOUS

## 2015-12-17 MED ORDER — SUCRALFATE 1 GM/10ML PO SUSP
1.0000 g | Freq: Three times a day (TID) | ORAL | Status: DC
  Administered 2015-12-17 – 2015-12-18 (×4): 1 g via ORAL
  Filled 2015-12-17 (×4): qty 10

## 2015-12-17 MED ORDER — SODIUM CHLORIDE 0.9 % IV SOLN: INTRAVENOUS | Status: DC

## 2015-12-17 MED ORDER — VITAMINS A & D EX OINT
TOPICAL_OINTMENT | CUTANEOUS | Status: AC
  Administered 2015-12-17: 03:00:00
  Filled 2015-12-17: qty 5

## 2015-12-17 MED ORDER — IOHEXOL 300 MG/ML  SOLN
100.0000 mL | Freq: Once | INTRAMUSCULAR | Status: AC | PRN
  Administered 2015-12-17: 100 mL via INTRAVENOUS

## 2015-12-17 MED ORDER — OXYCODONE-ACETAMINOPHEN 5-325 MG PO TABS
2.0000 | ORAL_TABLET | ORAL | Status: DC | PRN
  Administered 2015-12-17: 1 via ORAL
  Administered 2015-12-17 – 2015-12-18 (×7): 2 via ORAL
  Filled 2015-12-17 (×8): qty 2

## 2015-12-17 MED ORDER — PROPOFOL 10 MG/ML IV BOLUS
INTRAVENOUS | Status: AC
  Filled 2015-12-17: qty 40

## 2015-12-17 MED ORDER — ALBUTEROL SULFATE (2.5 MG/3ML) 0.083% IN NEBU: 3.0000 mL | INHALATION_SOLUTION | RESPIRATORY_TRACT | Status: DC

## 2015-12-17 SURGICAL SUPPLY — 14 items
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
## 2015-12-17 NOTE — Transfer of Care (Signed)
Immediate Anesthesia Transfer of Care Note  Patient: Deanna [name]  Procedure(s) Performed: Procedure(s): ESOPHAGOGASTRODUODENOSCOPY (EGD) WITH PROPOFOL (N/A)  Patient Location: PACU  Anesthesia Type:MAC  Level of Consciousness: sedated  Airway & Oxygen Therapy: Patient Spontanous Breathing and Patient connected to nasal cannula oxygen  Post-op Assessment: Report given to RN and Post -op Vital signs reviewed and stable  Post vital signs: Reviewed and stable  Last Vitals:  Filed Vitals:   12/17/15 0659 12/17/15 0928  BP: 131/76 178/86  Pulse: 65 75  Temp: 36.6 C 36.7 C  Resp: 18 16    Complications: No apparent anesthesia complications

## 2015-12-17 NOTE — Anesthesia Preprocedure Evaluation (Addendum)
Anesthesia Evaluation  Patient identified by MRN, date of birth, ID band Patient awake    Reviewed: Allergy & Precautions, NPO status , Patient's Chart, lab work & pertinent test results  History of Anesthesia Complications Negative for: history of anesthetic complications  Airway Mallampati: II  TM Distance: >3 FB Neck ROM: Full    Dental no notable dental hx. (+) Dental Advisory Given, Poor Dentition, Chipped, Loose, Missing,    Pulmonary asthma , Current Smoker,    Pulmonary exam normal breath sounds clear to auscultation       Cardiovascular hypertension, Pt. on medications Normal cardiovascular exam Rhythm:Regular Rate:Normal     Neuro/Psych PSYCHIATRIC DISORDERS Anxiety Depression negative neurological ROS     GI/Hepatic negative GI ROS, (+)     substance abuse  marijuana use,   Endo/Other  negative endocrine ROS  Renal/GU negative Renal ROS  negative genitourinary   Musculoskeletal  (+) Arthritis ,   Abdominal   Peds negative pediatric ROS (+)  Hematology  (+) anemia ,   Anesthesia Other Findings   Reproductive/Obstetrics negative OB ROS                           Anesthesia Physical Anesthesia Plan  ASA: II  Anesthesia Plan: MAC   Post-op Pain Management:    Induction: Intravenous  Airway Management Planned: Nasal Cannula  Additional Equipment:   Intra-op Plan:   Post-operative Plan:   Informed Consent: I have reviewed the patients History and Physical, chart, labs and discussed the procedure including the risks, benefits and alternatives for the proposed anesthesia with the patient or authorized representative who has indicated his/her understanding and acceptance.   Dental advisory given  Plan Discussed with: CRNA  Anesthesia Plan Comments:         Anesthesia Quick Evaluation

## 2015-12-17 NOTE — Op Note (Signed)
[facility] [address] [place], [address]   ENDOSCOPY PROCEDURE REPORT  PATIENT: Deanna [name], Deanna [name]  MR#: [id number] BIRTHDATE: [birth year] , 44  yrs. old GENDER: female ENDOSCOPIST: Vida Rigger, MD REFERRED BY: PROCEDURE DATE:  Jan 01, 2016 PROCEDURE:  EGD w/ biopsy ASA CLASS:     Class II INDICATIONS:  heme positive stool and acute post hemorrhagic anemia.  MEDICATIONS: Propofol 250 mg IV TOPICAL ANESTHETIC: none  DESCRIPTION OF PROCEDURE: After the risks benefits and alternatives of the procedure were thoroughly explained, informed consent was obtained.  The Pentax Gastroscope [id number] endoscope was introduced through the mouth and advanced to the second portion of the duodenum , Without limitations.  The instrument was slowly withdrawn as the mucosa was fully examined. Estimated blood loss is zero unless otherwise noted in this procedure report.    The findings are recorded below       Retroflexed views revealed no abnormalities.     The scope was then withdrawn from the patient and the procedure completed.  COMPLICATIONS: There were no immediate complications.  ENDOSCOPIC IMPRESSION: 1.Questionable tiny hiatal hernia 2. Moderate size antral ulcer with flat white base status post biopsy 3.Otherwise within normal limits EGD status post fundal and antral biopsy to rule out H. pylori   RECOMMENDATIONS: slowly advance diet no aspirin or nonsteroidals  await pathology twice a day pump inhibitors for 1 month then once a day      nd proceed withcolonoscopy if bright red blood continues and happy to see back in one month to recheck symptoms guaiac since CBC and make sure no further workup or plans are needed  REPEAT EXAM: as needed  eSigned:  Vida Rigger, MD 2016-01-01 11:07 AM    CC:  CPT CODES: ICD CODES:  The ICD and CPT codes recommended by this software are interpretations from the data that the clinical staff has captured with the software.   The verification of the translation of this report to the ICD and CPT codes and modifiers is the sole responsibility of the health care institution and practicing physician where this report was generated.  [employer] will not be held responsible for the validity of the ICD and CPT codes included on this report.  AMA assumes no liability for data contained or not contained herein. CPT is a Publishing rights manager of the [employer].  PATIENT NAME:  Deanna [name], Deanna [name] MR#: [id number]
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
## 2015-12-17 NOTE — Anesthesia Postprocedure Evaluation (Signed)
Anesthesia Post Note  Patient: Deanna [name]  Procedure(s) Performed: Procedure(s) (LRB): ESOPHAGOGASTRODUODENOSCOPY (EGD) WITH PROPOFOL (N/A)  Patient location during evaluation: PACU Anesthesia Type: MAC Level of consciousness: awake and alert Pain management: pain level controlled Vital Signs Assessment: post-procedure vital signs reviewed and stable Respiratory status: spontaneous breathing, nonlabored ventilation, respiratory function stable and patient connected to nasal cannula oxygen Cardiovascular status: blood pressure returned to baseline and stable Postop Assessment: no signs of nausea or vomiting Anesthetic complications: no    Last Vitals:  Filed Vitals:   12/17/15 1120 12/17/15 1355  BP: 172/97 183/88  Pulse: 80 71  Temp:  36.6 C  Resp: 11 13    Last Pain:  Filed Vitals:   12/17/15 1356  PainSc: 10-Worst pain ever                 Shelanda Duvall JENNETTE

## 2015-12-17 NOTE — Progress Notes (Signed)
Pt has c/o 10/10 pain in lower abdomen as well as headache throughout shift. Given percocet 1 tab per order at 1948; 1 hour later pt said it had no effect and asked me to page MD. Pt also very anxious/tearful and said she felt like she was going to have a panic attack. Paged on call provider and received order for morphine  IV prn and one time dose of ativan 0.5 mg IV. Meds given along with zofran for nausea at 2148, bringing pain to 8/10. At 2337 pt tearful and stated nothing we are giving her for pain is working and she is suffering with 10/10 pain. Paged on call provider again and order for percocet tabs changed from 1 to 2 tabs q4h prn and KUB ordered. 2nd tablet given, will continue to monitor for effect. 2nd unit of PRBC currently infusing and pt has tolerated well, VS stable. Mick Sell RN

## 2015-12-18 ENCOUNTER — Encounter (HOSPITAL_COMMUNITY): Payer: Self-pay | Admitting: Gastroenterology

## 2015-12-18 DIAGNOSIS — Z720 Tobacco use: Secondary | ICD-10-CM | POA: Diagnosis not present

## 2015-12-18 DIAGNOSIS — D62 Acute posthemorrhagic anemia: Secondary | ICD-10-CM | POA: Diagnosis not present

## 2015-12-18 DIAGNOSIS — K922 Gastrointestinal hemorrhage, unspecified: Secondary | ICD-10-CM | POA: Diagnosis not present

## 2015-12-18 DIAGNOSIS — F418 Other specified anxiety disorders: Secondary | ICD-10-CM | POA: Diagnosis not present

## 2015-12-18 LAB — CBC WITH DIFFERENTIAL/PLATELET
Basophils Absolute: 0.1 10*3/uL (ref 0.0–0.1)
Basophils Relative: 1 %
EOS ABS: 0.3 10*3/uL (ref 0.0–0.7)
EOS PCT: 4 %
HCT: 29.4 % — ABNORMAL LOW (ref 36.0–46.0)
Hemoglobin: 9.6 g/dL — ABNORMAL LOW (ref 12.0–15.0)
LYMPHS ABS: 1.9 10*3/uL (ref 0.7–4.0)
Lymphocytes Relative: 22 %
MCH: 27.9 pg (ref 26.0–34.0)
MCHC: 32.7 g/dL (ref 30.0–36.0)
MCV: 85.5 fL (ref 78.0–100.0)
MONOS PCT: 6 %
Monocytes Absolute: 0.6 10*3/uL (ref 0.1–1.0)
Neutro Abs: 5.9 10*3/uL (ref 1.7–7.7)
Neutrophils Relative %: 67 %
PLATELETS: 386 10*3/uL (ref 150–400)
RBC: 3.44 MIL/uL — ABNORMAL LOW (ref 3.87–5.11)
RDW: 15.1 % (ref 11.5–15.5)
WBC: 8.6 10*3/uL (ref 4.0–10.5)

## 2015-12-18 LAB — BASIC METABOLIC PANEL
Anion gap: 7 (ref 5–15)
CALCIUM: 8.2 mg/dL — AB (ref 8.9–10.3)
CO2: 23 mmol/L (ref 22–32)
CREATININE: 0.56 mg/dL (ref 0.44–1.00)
Chloride: 107 mmol/L (ref 101–111)
GFR calc Af Amer: >60 mL/min (ref >60)
Glucose, Bld: 92 mg/dL (ref 65–99)
Potassium: 4 mmol/L (ref 3.5–5.1)
SODIUM: 137 mmol/L (ref 135–145)

## 2015-12-18 LAB — TSH: TSH: 2.559 u[IU]/mL (ref 0.350–4.500)

## 2015-12-18 LAB — MAGNESIUM: MAGNESIUM: 2.1 mg/dL (ref 1.7–2.4)

## 2015-12-18 MED ORDER — PANTOPRAZOLE SODIUM 40 MG PO TBEC: 40.0000 mg | DELAYED_RELEASE_TABLET | Freq: Two times a day (BID) | ORAL | Status: DC

## 2015-12-18 MED ORDER — POLYETHYLENE GLYCOL 3350 17 G PO PACK: 17.0000 g | PACK | Freq: Every day | ORAL | Status: DC

## 2015-12-18 MED ORDER — SUCRALFATE 1 GM/10ML PO SUSP: 1.0000 g | Freq: Three times a day (TID) | ORAL | Status: DC

## 2015-12-18 MED ORDER — SENNOSIDES-DOCUSATE SODIUM 8.6-50 MG PO TABS: 1.0000 | ORAL_TABLET | Freq: Every day | ORAL | Status: DC

## 2015-12-18 MED ORDER — ACETAMINOPHEN 325 MG PO TABS: 650.0000 mg | ORAL_TABLET | Freq: Four times a day (QID) | ORAL | Status: AC | PRN

## 2015-12-18 MED ORDER — POLYETHYLENE GLYCOL 3350 17 G PO PACK
17.0000 g | PACK | Freq: Every day | ORAL | Status: DC
  Administered 2015-12-18: 17 g via ORAL
  Filled 2015-12-18: qty 1

## 2015-12-18 NOTE — Progress Notes (Signed)
Patient d/c home,stable,no s/s of bleeding.
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
## 2015-12-20 LAB — TYPE AND SCREEN
ABO/RH(D): A POS
ANTIBODY SCREEN: NEGATIVE
UNIT DIVISION: 0
Unit division: 0
Unit division: 0
Unit division: 0

## 2016-05-05 ENCOUNTER — Emergency Department (HOSPITAL_COMMUNITY)
Admission: EM | Admit: 2016-05-05 | Discharge: 2016-05-06 | Disposition: A | Discharge status: Other Acute Inpatient Hospital | Payer: PRIVATE HEALTH INSURANCE | Attending: Emergency Medicine | Admitting: Emergency Medicine

## 2016-05-05 ENCOUNTER — Other Ambulatory Visit: Payer: Self-pay

## 2016-05-05 ENCOUNTER — Encounter (HOSPITAL_COMMUNITY): Payer: Self-pay | Admitting: *Deleted

## 2016-05-05 DIAGNOSIS — Y69 Unspecified misadventure during surgical and medical care: Secondary | ICD-10-CM | POA: Diagnosis not present

## 2016-05-05 DIAGNOSIS — T424X2A Poisoning by benzodiazepines, intentional self-harm, initial encounter: Secondary | ICD-10-CM | POA: Diagnosis not present

## 2016-05-05 DIAGNOSIS — J45909 Unspecified asthma, uncomplicated: Secondary | ICD-10-CM | POA: Insufficient documentation

## 2016-05-05 DIAGNOSIS — I10 Essential (primary) hypertension: Secondary | ICD-10-CM | POA: Insufficient documentation

## 2016-05-05 DIAGNOSIS — F329 Major depressive disorder, single episode, unspecified: Secondary | ICD-10-CM | POA: Diagnosis not present

## 2016-05-05 DIAGNOSIS — R4182 Altered mental status, unspecified: Secondary | ICD-10-CM | POA: Insufficient documentation

## 2016-05-05 DIAGNOSIS — F17210 Nicotine dependence, cigarettes, uncomplicated: Secondary | ICD-10-CM | POA: Diagnosis not present

## 2016-05-05 DIAGNOSIS — Z79899 Other long term (current) drug therapy: Secondary | ICD-10-CM | POA: Diagnosis not present

## 2016-05-05 LAB — CBC WITH DIFFERENTIAL/PLATELET
Basophils Absolute: 0 10*3/uL (ref 0.0–0.1)
Basophils Relative: 1 %
EOS PCT: 3 %
Eosinophils Absolute: 0.3 10*3/uL (ref 0.0–0.7)
HCT: 35.5 % — ABNORMAL LOW (ref 36.0–46.0)
Hemoglobin: 11.4 g/dL — ABNORMAL LOW (ref 12.0–15.0)
LYMPHS ABS: 1.7 10*3/uL (ref 0.7–4.0)
LYMPHS PCT: 20 %
MCH: 25.9 pg — AB (ref 26.0–34.0)
MCHC: 32.1 g/dL (ref 30.0–36.0)
MCV: 80.7 fL (ref 78.0–100.0)
MONO ABS: 0.4 10*3/uL (ref 0.1–1.0)
MONOS PCT: 4 %
Neutro Abs: 6.2 10*3/uL (ref 1.7–7.7)
Neutrophils Relative %: 72 %
PLATELETS: 353 10*3/uL (ref 150–400)
RBC: 4.4 MIL/uL (ref 3.87–5.11)
RDW: 19.8 % — AB (ref 11.5–15.5)
WBC: 8.6 10*3/uL (ref 4.0–10.5)

## 2016-05-05 LAB — COMPREHENSIVE METABOLIC PANEL WITH GFR
ALT: 12 U/L — ABNORMAL LOW (ref 14–54)
AST: 11 U/L — ABNORMAL LOW (ref 15–41)
Albumin: 3.4 g/dL — ABNORMAL LOW (ref 3.5–5.0)
Alkaline Phosphatase: 64 U/L (ref 38–126)
Anion gap: 4 — ABNORMAL LOW (ref 5–15)
BUN: 9 mg/dL (ref 6–20)
CO2: 23 mmol/L (ref 22–32)
Calcium: 8.4 mg/dL — ABNORMAL LOW (ref 8.9–10.3)
Chloride: 111 mmol/L (ref 101–111)
Creatinine, Ser: 0.46 mg/dL (ref 0.44–1.00)
GFR calc Af Amer: >60 mL/min
GFR calc non Af Amer: >60 mL/min
Glucose, Bld: 107 mg/dL — ABNORMAL HIGH (ref 65–99)
Potassium: 3.8 mmol/L (ref 3.5–5.1)
Sodium: 138 mmol/L (ref 135–145)
Total Bilirubin: 0.2 mg/dL — ABNORMAL LOW (ref 0.3–1.2)
Total Protein: 6.5 g/dL (ref 6.5–8.1)

## 2016-05-05 LAB — RAPID URINE DRUG SCREEN, HOSP PERFORMED
Amphetamines: NOT DETECTED
Barbiturates: NOT DETECTED
Benzodiazepines: POSITIVE — AB
Cocaine: NOT DETECTED
Opiates: POSITIVE — AB
Tetrahydrocannabinol: POSITIVE — AB

## 2016-05-05 LAB — ETHANOL: Alcohol, Ethyl (B): <5 mg/dL

## 2016-05-05 LAB — PREGNANCY, URINE: PREG TEST UR: NEGATIVE

## 2016-05-05 LAB — ACETAMINOPHEN LEVEL

## 2016-05-05 LAB — SALICYLATE LEVEL

## 2016-05-05 MED ORDER — NICOTINE 14 MG/24HR TD PT24
14.0000 mg | MEDICATED_PATCH | Freq: Every day | TRANSDERMAL | Status: DC
  Administered 2016-05-05 – 2016-05-06 (×2): 14 mg via TRANSDERMAL
  Filled 2016-05-05 (×2): qty 1

## 2016-05-05 MED ORDER — HALOPERIDOL 5 MG PO TABS
5.0000 mg | ORAL_TABLET | Freq: Two times a day (BID) | ORAL | Status: DC
  Administered 2016-05-05 – 2016-05-06 (×3): 5 mg via ORAL
  Filled 2016-05-05 (×3): qty 1

## 2016-05-05 NOTE — ED Notes (Addendum)
Poison control called stating that they recommend another EKG in four to six hours to check for widening of the QRS duration. If widening greater than 120 poison control recommends administering Bicarb.

## 2016-05-05 NOTE — ED Notes (Signed)
Patient is resting comfortably. 

## 2016-05-05 NOTE — ED Notes (Signed)
Deanna [name] [phone] (CELL)
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
## 2016-05-05 NOTE — ED Notes (Signed)
Spoke with Patty at [facility] who advised to monitor pt for tachycardia, hypotension, orders should include ekg, tylenol level, cardiac monitor and to monitor pt for 6 hours,
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
## 2016-05-05 NOTE — ED Notes (Signed)
Spoke with pt's husband Burna SisFrankie Mende who advised that pt had made statements last night that she wanted to kill herself but he did not think that she would do anything, has been taking extra xanax since yesterday and unsure about benadryl but thinks that she may have taken some of those as well

## 2016-05-05 NOTE — ED Provider Notes (Signed)
Pt becoming more agitated She is yelling at staff She is walking around Waiting psych evaluation Vitals remain appropriate Haldol ordered for patient I feel she is medically stable and ready for psych evaluation   Deanna Rhineonald Starlett Pehrson, MD 05/05/16 1424

## 2016-05-05 NOTE — ED Notes (Signed)
[employer] at bedside 

## 2016-05-05 NOTE — ED Notes (Signed)
Patient is resting and allowed staff to help with ADL's

## 2016-05-05 NOTE — ED Notes (Signed)
Patients husband called for update.

## 2016-05-05 NOTE — ED Provider Notes (Signed)
Pt somnolent but arousable Repeat ekg performed   EKG Interpretation  Date/Time:  Monday May 05 2016 10:47:32 EDT Ventricular Rate:  73 PR Interval:    QRS Duration: 120 QT Interval:  434 QTC Calculation: 479 R Axis:   78 Text Interpretation:  Sinus rhythm Nonspecific intraventricular conduction delay artifact noted No significant change since last tracing Confirmed by Bebe ShaggyWICKLINE  MD, Deaira Leckey ([id number]) on 05/05/2016 11:11:17 AM      Awaiting TTS evaluation  Deanna Rhineonald Quantina Dershem, MD 05/05/16 [phone]
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
## 2016-05-05 NOTE — ED Notes (Signed)
Patient again agitated. Wanting to walk to restroom. Not safe for patient to walk at this time due to unsteady gait. After several minutes, patient [facility] that had been offered to her multiple times. Voided large amount. Returned to bed.

## 2016-05-05 NOTE — ED Notes (Signed)
Patient has become non -cooperative.

## 2016-05-05 NOTE — Progress Notes (Signed)
This writer completed a chart review for disposition.    Ryett Hamman, MSW, LCSW, LCAS [facility] Triage Specialist [phone] 

## 2016-05-05 NOTE — ED Notes (Addendum)
Poison Control called to check on pt status and for update. Relayed pertinent labs, vital signs., and mentation. Poison Control states that agitation and hallucinations are expected with benadryl intoxication. Recommend EKG, none found in Epic. EKG obtained at 1047. PC recommends observation until patient is at baseline mentation with normal EKG and normal vital signs.

## 2016-05-05 NOTE — ED Notes (Signed)
[facility] with c/o overdose, pt was noted to have taken approximately 30 2mg  xanax tonight that belonged to her son, prescription bottle was filled two days ago, has 4 remaining tablets in bottle, pt speech slurred, difficult to understand, will follow commands,

## 2016-05-05 NOTE — ED Notes (Signed)
Pt called out stating that she had to pee, Angelina NT went into pt's room to help her and pt became upset, screaming at staff. RN went into room to find pt standing up beside bed, screaming at staff, security called for assistance, comfort measures provided, pt assisted back to bed, pt difficult to redirect.

## 2016-05-05 NOTE — ED Notes (Signed)
Patient made involuntary due to behavior and wanting to leave.

## 2016-05-05 NOTE — ED Notes (Signed)
Patient agitated and trying to leave department. Patient swinging arms and legs at staff and security. Patient cursing at staff. Staff attempting to clean patient who is on menstrual cycle. Patient uncooperative and yelling. After multiple attempts, patient's underwear is changed. New linens applied to bed. Breakfast tray provided. VS obtained.

## 2016-05-05 NOTE — ED Notes (Signed)
Poison control called about patient. Vital signs WNL.

## 2016-05-06 ENCOUNTER — Other Ambulatory Visit: Payer: Self-pay

## 2016-05-06 ENCOUNTER — Encounter: Payer: Self-pay | Admitting: Psychiatry

## 2016-05-06 ENCOUNTER — Inpatient Hospital Stay
Admission: EM | Admit: 2016-05-06 | Discharge: 2016-05-08 | DRG: 885 | Disposition: A | Discharge status: Home / Self Care | Payer: PRIVATE HEALTH INSURANCE | Source: Intra-hospital | Attending: Psychiatry | Admitting: Psychiatry

## 2016-05-06 DIAGNOSIS — Z9889 Other specified postprocedural states: Secondary | ICD-10-CM

## 2016-05-06 DIAGNOSIS — Z808 Family history of malignant neoplasm of other organs or systems: Secondary | ICD-10-CM | POA: Diagnosis not present

## 2016-05-06 DIAGNOSIS — I10 Essential (primary) hypertension: Secondary | ICD-10-CM | POA: Diagnosis present

## 2016-05-06 DIAGNOSIS — G4700 Insomnia, unspecified: Secondary | ICD-10-CM | POA: Diagnosis present

## 2016-05-06 DIAGNOSIS — Z818 Family history of other mental and behavioral disorders: Secondary | ICD-10-CM

## 2016-05-06 DIAGNOSIS — J45909 Unspecified asthma, uncomplicated: Secondary | ICD-10-CM | POA: Diagnosis present

## 2016-05-06 DIAGNOSIS — G8929 Other chronic pain: Secondary | ICD-10-CM | POA: Diagnosis present

## 2016-05-06 DIAGNOSIS — F1320 Sedative, hypnotic or anxiolytic dependence, uncomplicated: Secondary | ICD-10-CM | POA: Diagnosis present

## 2016-05-06 DIAGNOSIS — M549 Dorsalgia, unspecified: Secondary | ICD-10-CM | POA: Diagnosis present

## 2016-05-06 DIAGNOSIS — M5136 Other intervertebral disc degeneration, lumbar region: Secondary | ICD-10-CM | POA: Diagnosis present

## 2016-05-06 DIAGNOSIS — T50901A Poisoning by unspecified drugs, medicaments and biological substances, accidental (unintentional), initial encounter: Secondary | ICD-10-CM | POA: Diagnosis present

## 2016-05-06 DIAGNOSIS — T424X2A Poisoning by benzodiazepines, intentional self-harm, initial encounter: Secondary | ICD-10-CM | POA: Diagnosis not present

## 2016-05-06 DIAGNOSIS — Z9119 Patient's noncompliance with other medical treatment and regimen: Secondary | ICD-10-CM

## 2016-05-06 DIAGNOSIS — Z8249 Family history of ischemic heart disease and other diseases of the circulatory system: Secondary | ICD-10-CM | POA: Diagnosis not present

## 2016-05-06 DIAGNOSIS — F17200 Nicotine dependence, unspecified, uncomplicated: Secondary | ICD-10-CM | POA: Diagnosis present

## 2016-05-06 DIAGNOSIS — R45851 Suicidal ideations: Secondary | ICD-10-CM | POA: Diagnosis present

## 2016-05-06 DIAGNOSIS — T50902A Poisoning by unspecified drugs, medicaments and biological substances, intentional self-harm, initial encounter: Secondary | ICD-10-CM | POA: Diagnosis present

## 2016-05-06 DIAGNOSIS — Z833 Family history of diabetes mellitus: Secondary | ICD-10-CM

## 2016-05-06 DIAGNOSIS — F332 Major depressive disorder, recurrent severe without psychotic features: Principal | ICD-10-CM | POA: Diagnosis present

## 2016-05-06 MED ORDER — NICOTINE 21 MG/24HR TD PT24
21.0000 mg | MEDICATED_PATCH | Freq: Every day | TRANSDERMAL | Status: DC
  Administered 2016-05-07 – 2016-05-08 (×2): 21 mg via TRANSDERMAL
  Filled 2016-05-06 (×3): qty 1

## 2016-05-06 MED ORDER — ACETAMINOPHEN 325 MG PO TABS
650.0000 mg | ORAL_TABLET | Freq: Four times a day (QID) | ORAL | Status: DC | PRN
  Administered 2016-05-06 – 2016-05-07 (×2): 650 mg via ORAL
  Filled 2016-05-06 (×2): qty 2

## 2016-05-06 MED ORDER — CHLORDIAZEPOXIDE HCL 25 MG PO CAPS
25.0000 mg | ORAL_CAPSULE | Freq: Four times a day (QID) | ORAL | Status: DC
  Administered 2016-05-06 – 2016-05-07 (×2): 25 mg via ORAL
  Filled 2016-05-06 (×2): qty 1

## 2016-05-06 MED ORDER — MAGNESIUM HYDROXIDE 400 MG/5ML PO SUSP: 30.0000 mL | Freq: Every day | ORAL | Status: DC | PRN

## 2016-05-06 MED ORDER — TRAZODONE HCL 100 MG PO TABS
100.0000 mg | ORAL_TABLET | Freq: Every day | ORAL | Status: DC
  Administered 2016-05-06 – 2016-05-07 (×2): 100 mg via ORAL
  Filled 2016-05-06 (×2): qty 1

## 2016-05-06 MED ORDER — ALUM & MAG HYDROXIDE-SIMETH 200-200-20 MG/5ML PO SUSP: 30.0000 mL | ORAL | Status: DC | PRN

## 2016-05-06 NOTE — ED Notes (Signed)
Called Security to come escort pt to shower.

## 2016-05-06 NOTE — ED Notes (Signed)
EKG completed and poison control updated.

## 2016-05-06 NOTE — ED Notes (Signed)
Pt offered shower.  °

## 2016-05-06 NOTE — ED Notes (Signed)
Pt using phone at this time.  

## 2016-05-06 NOTE — ED Notes (Signed)
Meal given to pt, family member also brought extra clothes for pt to have locked in locker for when pt is transferred.

## 2016-05-06 NOTE — ED Notes (Signed)
Pt cuff from left ankle removed by [employer] officer for transport to shower.  Pt is ambulatory at this time.
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
## 2016-05-06 NOTE — ED Notes (Signed)
Poison control updated on pt's condition. They recommend a repeat EKG for medical clearance.

## 2016-05-06 NOTE — ED Notes (Signed)
Pt taken to bathroom, cuff from left ankle removed.

## 2016-05-06 NOTE — ED Notes (Signed)
Cuff removed from left ankle.

## 2016-05-06 NOTE — ED Notes (Signed)
Poison control called requesting EKG.

## 2016-05-06 NOTE — ED Notes (Addendum)
Pt escorted by Val, security to shower. Pt given new paper scrubs, socks, washcloths, towels, shampoo, bodywash, toothbrush and toothpaste.

## 2016-05-06 NOTE — ED Notes (Signed)
Husband and son at bedside, security escorted to room and wanded.

## 2016-05-06 NOTE — ED Provider Notes (Signed)
[facility] by Dr. Jennet MaduroPucilowska. She is stable for transfer.  BP 184/98 mmHg  Pulse 78  Temp(Src) 98.3 F (36.8 C) (Oral)  Resp 18  SpO2 95%   Glynn OctaveStephen Ameila Weldon, MD 05/06/16 [phone]

## 2016-05-06 NOTE — ED Notes (Signed)
Nicotine patch will be held until 1422 today due to medication being q24h and last application was yesterday at 1422.

## 2016-05-06 NOTE — ED Notes (Signed)
Meal given to pt, pt sleeping at this time, [facility] police dept at bedside with pt.

## 2016-05-06 NOTE — Progress Notes (Signed)
Pt accepted to [facility] room 318, attending Dr. Jennet MaduroPucilowska. Report can be called at [phone]. Pt under IVC.

## 2016-05-07 DIAGNOSIS — F332 Major depressive disorder, recurrent severe without psychotic features: Principal | ICD-10-CM

## 2016-05-07 MED ORDER — HYDROCHLOROTHIAZIDE 25 MG PO TABS: 25.0000 mg | ORAL_TABLET | Freq: Every day | ORAL | Status: DC

## 2016-05-07 MED ORDER — TRAZODONE HCL 100 MG PO TABS: 100.0000 mg | ORAL_TABLET | Freq: Every day | ORAL | Status: AC

## 2016-05-07 MED ORDER — ESCITALOPRAM OXALATE 10 MG PO TABS: 10.0000 mg | ORAL_TABLET | Freq: Every day | ORAL | Status: DC

## 2016-05-07 MED ORDER — TRAZODONE HCL 100 MG PO TABS: 100.0000 mg | ORAL_TABLET | Freq: Every day | ORAL | Status: DC

## 2016-05-07 MED ORDER — ESCITALOPRAM OXALATE 10 MG PO TABS
10.0000 mg | ORAL_TABLET | Freq: Every day | ORAL | Status: DC
  Administered 2016-05-07 – 2016-05-08 (×2): 10 mg via ORAL
  Filled 2016-05-07 (×2): qty 1

## 2016-05-07 MED ORDER — HYDROCHLOROTHIAZIDE 25 MG PO TABS: 25.0000 mg | ORAL_TABLET | Freq: Every day | ORAL | Status: AC

## 2016-05-07 MED ORDER — DIPHENHYDRAMINE HCL 25 MG PO CAPS: 25.0000 mg | ORAL_CAPSULE | Freq: Three times a day (TID) | ORAL | Status: DC | PRN

## 2016-05-07 MED ORDER — IBUPROFEN 800 MG PO TABS
800.0000 mg | ORAL_TABLET | Freq: Three times a day (TID) | ORAL | Status: DC | PRN
  Administered 2016-05-07 – 2016-05-08 (×2): 800 mg via ORAL
  Filled 2016-05-07 (×2): qty 1

## 2016-05-07 MED ORDER — HYDROCHLOROTHIAZIDE 25 MG PO TABS
25.0000 mg | ORAL_TABLET | Freq: Every day | ORAL | Status: DC
  Administered 2016-05-07 – 2016-05-08 (×2): 25 mg via ORAL
  Filled 2016-05-07 (×2): qty 1

## 2016-05-07 MED ORDER — ESCITALOPRAM OXALATE 10 MG PO TABS: 10.0000 mg | ORAL_TABLET | Freq: Every day | ORAL | Status: AC

## 2016-05-07 NOTE — [facility] Group Notes (Signed)
[facility] Group Notes:  (Nursing/MHT/Case Management/Adjunct)  Date:  05/07/2016  Time:  9:49 PM  Type of Therapy:  Group Therapy  Participation Level:  Active  Participation Quality:  Appropriate  Affect:  Appropriate  Cognitive:  Appropriate  Insight:  Appropriate  Engagement in Group:  Engaged  Modes of Intervention:  Discussion  Summary of Progress/Problems:  Deanna EkJanice Marie Lille [name] 05/07/2016, 9:49 PM

## 2016-05-07 NOTE — Plan of Care (Signed)
Problem: Coping: Goal: Ability to verbalize feelings will improve Outcome: Progressing Patient verbalized feelings of stressors and home situation to Clinical research associatewriter
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
## 2016-05-07 NOTE — [facility] Group Notes (Signed)
[facility] Group Notes:  (Nursing/MHT/Case Management/Adjunct)  Date:  05/07/2016  Time:  6:54 PM  Type of Therapy:  Psychoeducational Skills  Participation Level:  Active  Participation Quality:  Appropriate, Attentive and Supportive  Affect:  Appropriate  Cognitive:  Appropriate  Insight:  Appropriate  Engagement in Group:  Supportive  Modes of Intervention:  Discussion and Education  Summary of Progress/Problems: patient stayed in group the entire time and was supportive to others.    Twanna Hymanda C Nargis Abrams 05/07/2016, 6:54 PM

## 2016-05-07 NOTE — [facility] Group Notes (Signed)
[facility] LCSW Group Therapy  05/07/2016 9:58 AM  Type of Therapy:  Group Therapy  Participation Level:  Active  Participation Quality:  Attentive  Affect:  Appropriate  Cognitive:  Alert  Insight:  Improving  Engagement in Therapy:  Improving  Modes of Intervention:  Discussion, Education, Socialization and Support  Summary of Progress/Problems: Emotional Regulation: Patients will identify both negative and positive emotions. They will discuss emotions they have difficulty regulating and how they impact their lives. Patients will be asked to identify healthy coping skills to combat unhealthy reactions to negative emotions.   Toniann FailWendy attended group and stayed the entire time. She discussed feeling angry and confused prior to admission. She states her son requires 24/7 care which is stressful. She would like to know who can help relieve some of this stress.   Sempra EnergyCandace L Coy Vandoren MSW, LCSWA  05/07/2016, 9:58 AM

## 2016-05-07 NOTE — [facility] Suicide Risk Assessment (Signed)
[facility] INPATIENT:  Family/Significant Other Suicide Prevention Education  Suicide Prevention Education:  Education Completed; Burna SisFrankie Dettloff Ph#: [phone] has been identified by the patient as the family member/significant other with whom the patient will be residing, and identified as the person(s) who will aid the patient in the event of a mental health crisis (suicidal ideations/suicide attempt).  With written consent from the patient, the family member/significant other has been provided the following suicide prevention education, prior to the and/or following the discharge of the patient.   The suicide prevention education provided includes the following:  Suicide risk factors  Suicide prevention and interventions  National Suicide Hotline telephone number  [facility] assessment telephone number  [facility] [place] Emergency Assistance 911  [facility] and/or Residential Mobile Crisis Unit telephone number  Request made of family/significant other to:  Remove weapons (e.g., guns, rifles, knives), all items previously/currently identified as safety concern.    Remove drugs/medications (over-the-counter, prescriptions, illicit drugs), all items previously/currently identified as a safety concern.  The family member/significant other verbalizes understanding of the suicide prevention education information provided.  The family member/significant other agrees to remove the items of safety concern listed above.  Lynden OxfordKadijah R Jaziah Kwasnik, LCSW-A 05/07/2016, 4:00 PM

## 2016-05-07 NOTE — Progress Notes (Signed)
Recreation Therapy Notes  Date: 06.21.17 Time: 1:00 pm Location: Craft Room  Group Topic: Self-esteem  Goal Area(s) Addresses:  Patient will be able to identify benefit of self-esteem. Patient will be able to identify ways to increase self-esteem.  Behavioral Response: Attentive, Interactive  Intervention: Self-Portrait  Activity: Patients were given a blank face worksheet and instructed to draw their self-portrait of how they were feeling. Patients were given paper and told to put their name and something positive about themselves. Patient passed their papers around the group and wrote positive traits about peers. Patient drew their self-portrait after their read the positive traits.  Education: LRT educated patients on ways they can increase their self-esteem.  Education Outcome: Acknowledges education/In group clarification offered  Clinical Observations/Feedback: Patient completed activity by drawing both self-portraits and writing positive trait about herself and her peers. Patient contributed to group discussion by stating how her faces were different, and how she felt after reading the positive traits from peers.  [name],Rebbie Lauricella M, LRT/CTRS 05/07/2016 4:21 PM
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
## 2016-05-07 NOTE — Plan of Care (Signed)
Problem: Spiritual Needs Goal: Ability to function at adequate level Outcome: Progressing Medically cleared, stable, no agitation, no crying spells, able to verbalize stressors, feelings, concerns; nursing staffs will continue to encourage verbalization of feelings.

## 2016-05-07 NOTE — Progress Notes (Signed)
Patient became tearful during assessment this morning. Reports her stressors are her 45 year old son that is total care, patient is responsible for care 24 hours a day with no help. Pt reports husband works and does not help around house. Patient became tearful reporting that she loves her son and promised him they would never put him in a facility, but she is exhausted. Pt also concerned with financial situation they are in at present with herself being in hospital.  Encouragement and support offered. Patient receptive and remains safe on unit with q 15 min checks.
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
## 2016-05-07 NOTE — Progress Notes (Signed)
Chaplain was able to spend time with patient and offer words of encouragement and prayer. The patient is exhausted from having to take care of her son Deanna [name] by herself. Her husband is there, but not as present and therefore all of the responsibility falls to her regularly. This has not only caused fatigue, but caused Deanna [name] to deal with depression, doubt, anxiety and ultimately fueled her desire to take her life from what she says. We prayed together and I made the patient aware of the counseling we provide and that we have a woman counselor on staff that can spend some time with her. She was intrigued and requested this service. Chaplain has already spoken with Frederica Kusterhaplain Brianna and she will go by when available to provide continued service.
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
## 2016-05-07 NOTE — Progress Notes (Signed)
Recreation Therapy Notes  INPATIENT RECREATION THERAPY ASSESSMENT  Patient Details Name: Deanna [name] MRN: [id number] DOB: 12-29-1970 Today's Date: 05/07/2016  Patient Stressors: Family (Taking care of handicap son and needs more help)  Coping Skills:   Isolate, Avoidance, Art/Dance, Music, Sports  Personal Challenges: Communication, Relationships, Self-Esteem/Confidence, Social Interaction, Stress Management, Trusting Others  Leisure Interests (2+):  Music - Listen, Individual - Other (Comment) (Watch funny movies)  Awareness of Community Resources:  Yes  Community Resources:  [place], [employer]  Current Use: No  If no, Barriers?: Transportation  Patient Strengths:  Speak well - word things properly; lost weight  Patient Identified Areas of Improvement:  Get dentures  Current Recreation Participation:  Be outside, plant flowers, and take walks  Patient Goal for Hospitalization:  For pain to go away that is caused by medication and go back to family  [place]  [place]idence[place]   Current [place] (including self-harm):  No  Current HI:  No  Consent to Intern Participation: N/A   [name],[name], LRT/CTRS 05/07/2016, 5:03 PM

## 2016-05-07 NOTE — Progress Notes (Signed)
   05/07/16 1400  Clinical Encounter Type  Visited With Patient  Visit Type Follow-up  Referral From Chaplain  Consult/Referral To Chaplain  Spiritual Encounters  Spiritual Needs Prayer  Stress Factors  Patient Stress Factors Family relationships;Lack of caregivers  Patient is a married middle aged white female with a 45 year old disabled son, with whom she serves as his primary care giver. 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   Patient will be released on tomorrow but would benefit from a support group of mothers alike, counseling, and if possible some respite services that would allow her to do those things.   Fisher ScientificChaplain Junelle Hashemi [phone]
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
## 2016-05-08 NOTE — Plan of Care (Signed)
Problem: Medication: Goal: Compliance with prescribed medication regimen will improve Outcome: Progressing Pt compliant with medication regimen.   
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
## 2016-05-08 NOTE — Progress Notes (Signed)
CSW spoke to the pt's husband Burna SisFrankie Rohrig regarding the pt's insurance infor, in order to find a f/u.  Pt's insurance is [facility]:  Member ID number: [id number] Group ID [id number] Group Name: [facility] [employer] [facility] [employer] [facility]  Dorothe PeaJonathan F. Holdyn Poyser, LCSWA, LCAS  05/08/16

## 2016-05-08 NOTE — Tx Team (Signed)
Interdisciplinary Treatment Plan Update (Adult)         Date: 05/08/2016   Time Reviewed: 9:30 AM   Progress in Treatment: Improving Attending groups: Yes  Participating in groups: Yes  Taking medication as prescribed: Yes  Tolerating medication: Yes  Family/Significant other contact made: Yes, CSW has spoken with the pt's husband  Patient understands diagnosis: Yes  Discussing patient identified problems/goals with staff: Yes  Medical problems stabilized or resolved: Yes  Denies suicidal/homicidal ideation: Yes  Issues/concerns per patient self-inventory: Yes  Other:   New problem(s) identified: N/A   Discharge Plan or Barriers: Pt will discharge to her home in [place] to live with her husband and will follow up with [facility] for medication management, substance abuse treatment and therapy.    Reason for Continuation of Hospitalization:   Depression   Anxiety   Medication Stabilization   Comments: N/A   Estimated date of discharge: 05/08/16     Patient is a 45 year old female with a history of depression.  Chief complaint. "I don't know why he overdosed." History of present illness. Information was obtained from the patient and the chart. The patient has a long history of depression but has not been taking any medications since 2008 when she became born again. In the past several months however she has been increasingly depressed and overwhelmed with his responsibilities. 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 She has not been able to find anybody to help her with her responsibilities and has no respite. Her husband works outside of the house asked her to take care of everything and has not been helpful. The patient became increasingly depressed with poor sleep, decreased appetite, anhedonia, feeling of guilt and hopelessness worthlessness, poor energy and  constant, social isolation, crying spells. She denies ever planning suicide. She cannot explain why she overdosed on medication. Possibly it was planned for help. The initial information in the chart says that the patient took a month's supply of her son's Xanax in addition to Benadryl. The patient now claims that the husband found missing Xanax and that she only took a few tablets. She does admit to taking a bottle of Benadryl. She adamantly denies misusing her son's Xanax. She denies psychotic symptoms or symptoms suggestive of bipolar mania. She denies anxiety since she became born again. Her major problem is insomnia. She now feels that she is ready to start back on medications. She denies alcohol or illicit substance or prescription pill abuse.  Past psychiatric history. There is a history of depression. She has been tried on multiple medications. She thinks that Zoloft and Lexapro worked best for her. She denies suicide attempts. She had one hospitalization in 2008.  Family psychiatric history. Multiple family members with depression and anxiety.  Social history. 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 She has no intention to ever to place him in a facility until she dies. 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 She however is unable to find anybody to help her even though her son is allowed over 500 hours a year for respite. She has no other children. She lives with her husband who has not been very supportive. She doesn't drive a car so she is unable to participate in any activities outside of the house including church. [place] [facility] several years ago to be closer to the family. She found very little help from them. . Patient lives in [place]. Patient will benefit from crisis stabilization, medication evaluation, group therapy, and psycho education in addition to case management for  discharge planning. Patient and CSW reviewed pt's identified goals and treatment plan. Pt verbalized understanding and agreed to treatment plan.    Review of initial/current patient goals per problem list:  1. Goal(s): Patient will participate in aftercare plan   Met: Yes  Target date: 3-5 days post admission date   As evidenced by: Patient will participate within aftercare plan AEB aftercare provider and housing plan at discharge being identified.   6/22: Pt will discharge to her home in [place] to live with her husband and will follow up with [facility] for medication management, substance abuse treatment and therapy.     2. Goal (s): Patient will exhibit decreased depressive symptoms and suicidal ideations.   Met: Adequate for discharge per MD.  Target date: 3-5 days post admission date   As evidenced by: Patient will utilize self-rating of depression at 3 or below and demonstrate decreased signs of depression or be deemed stable for discharge by MD.   6/22: Adequate for discharge per MD.    3. Goal(s): Patient will demonstrate decreased signs and symptoms of anxiety.   Met: Adequate for discharge per MD.  Target date: 3-5 days post admission date   As evidenced by: Patient will utilize self-rating of anxiety at 3 or below and demonstrated decreased signs of anxiety, or be deemed stable for discharge by MD   Adequate for discharge per MD.    4. Goal(s): Patient will demonstrate decreased signs of withdrawal due to substance abuse   Met: Yes  Target date: 3-5 days post admission date   As evidenced by: Patient will produce a CIWA/COWS score of 0, have stable vitals signs, and no symptoms of withdrawal   6/22: Patient produced a CIWA/COWS score of 0, has stable vitals signs, and no symptoms of withdrawal     Attendees:  Patient: Deanna [name] Family:  Physician: Dr. Bary Leriche, MD    05/08/2016 9:30 AM  Nursing: Carolynn Sayers, RN     05/08/2016 9:30 AM  Clinical Social Worker: Marylou Flesher, [place]  05/08/2016 9:30 AM  Recreational Therapist: Everitt Amber   05/08/2016 9:30 AM  Other:        05/08/2016 9:30 AM  Other:        05/08/2016 9:30 AM  Other:        05/08/2016 9:30 AM

## 2016-05-08 NOTE — Progress Notes (Signed)
Recreation Therapy Notes  INPATIENT RECREATION TR PLAN  Patient Details Name: Deanna [name] MRN: [id number] DOB: 01/30/1971 Today's Date: 05/08/2016  Rec Therapy Plan Is patient appropriate for Therapeutic Recreation?: Yes Treatment times per week: At least once a week TR Treatment/Interventions: 1:1 session, Group participation (Comment) (Appropriate participation in daily recreational therapy tx)  Discharge Criteria Pt will be discharged from therapy if:: Treatment goals are met, Discharged Treatment plan/goals/alternatives discussed and agreed upon by:: Patient/family  Discharge Summary Short term goals set: See Care Plan Short term goals met: Complete Progress toward goals comments: One-to-one attended Which groups?: Self-esteem One-to-one attended: Self-esteem, stress management Reason goals not met: N/A Therapeutic equipment acquired: None Reason patient discharged from therapy: Discharge from hospital Pt/family agrees with progress & goals achieved: Yes Date patient discharged from therapy: 05/08/16   , M, LRT/CTRS 05/08/2016, 4:01 PM  

## 2016-05-08 NOTE — Progress Notes (Signed)
Pleasant and cooperative.  Affect sad.  Denies SI/HI/AVH.  Verbalizes mild anxiety.   Discharge instructions given, verbalized understanding.  Prescriptions and seven day supply of medications given.  Personal belongings returned to patient.  Escorted off unit by this Clinical research associatewriter to main entrance to travel home with husband.

## 2016-05-08 NOTE — Plan of Care (Signed)
Problem: [facility] Participation in Recreation Therapeutic Interventions Goal: STG-Patient will demonstrate improved self esteem by identif STG: Self-Esteem - Within 3 treatment sessions, patient will verbalize at least 5 positive affirmation statements in one treatment sessions to increase self-esteem post d/c.  Outcome: Completed/Met Date Met:  05/08/16 Treatment Session 1; Completed 1 out of 1: At approximately 12:00 pm, LRT met with patient in consultation room. Patient verbalized 5 positive affirmation statements. Patient reported it felt "good". LRT encouraged patient to continue saying positive affirmation statements.  Leonette Monarch, LRT/CTRS 06.22.17 3:59 pm Goal: STG-Other Recreation Therapy Goal (Specify) STG: Stress Management - Within 3 treatment sessions, patient will verbalize understanding of the stress management techniques in one treatment session to increase stress management skills post d/c.  Outcome: Completed/Met Date Met:  05/08/16 Treatment Session 1; Completed 1 out of 1: At approximately 12:00 pm, LRT met with patient in consultation room. LRT educated and provided patient with handouts on stress management techniques. Patient verbalized understanding. LRT encouraged patient to read over and practice the stress management techniques.  Leonette Monarch, LRT/CTRS 06.22.17 4:01 pm

## 2016-05-08 NOTE — Progress Notes (Signed)
  [facility] :  Will you be returning to the same living situation after discharge:  Yes,  pt [place] At discharge, do you have transportation home?: Yes,   pt will be picked up by her husband Do you have the ability to pay for your medications: Pt will be provided with prescriptions at discharge  Release of information consent forms completed and in the chart;  Patient's signature needed at discharge.  Patient to Follow up at: Follow-up Information    [facility] to [facility]   Why:   Please arrive to the walk-in clinic between 8am-11am Monday through Friday for your hospital follow up for medication management, substance abuse tx and therapy. Bring your ID, social security card, insurance card, proof of income (or awards letter)   SolicitorContact information:   [facility] [place] [address] [place], [place] [address] Ph: [phone] Fax: [phone]      Next level of care provider has access to [facility] Link:no  Safety Planning and Suicide Prevention discussed: Yes,  completed with pt  Have you used any form of tobacco in the last 30 days? (Cigarettes, Smokeless Tobacco, Cigars, and/or Pipes): Yes  Has patient been referred to the Quitline?: Patient refused referral  Patient has been referred for addiction treatment: Yes  Dorothe PeaJonathan F Qamar Rosman 05/08/2016, 2:42 PM

## 2016-05-08 NOTE — Progress Notes (Signed)
D: Observed pt in room wincing from jaw pain. Patient alert and oriented x4. Patient denies SI/HI/AVH. Pt affect is anxious and depressed. Pt complained of severe jaw pain, pt indicated pain started shortly after taking Librium. Pt indicated that this is the first time pt has had this pain. Pt denied feeling depressed but rated anxiety 9/10 and said it was mostly due to her pain. Pt had hand by jaw and indicated that it hurt her to talk. Pt rated jaw pain 10/10, and tylenol was given. After an hour pt stated pain had increased. Pt also had elevated blood pressure A: Offered active listening and support. Provided therapeutic communication. Administered scheduled medications. Called Dr. Toni Amendlapacs informing of pain and blood pressure. Order for ibuprofen placed per doctor's order. Ibuprofen given prn for pain. No action taken on blood pressure per doctor. R: Pt pleasant and cooperative. Pt medication compliant. Pt fell asleep shortly after receiving ibuprofen. Will continue Q15 min. checks. Safety maintained.

## 2016-06-25 ENCOUNTER — Encounter (HOSPITAL_BASED_OUTPATIENT_CLINIC_OR_DEPARTMENT_OTHER): Payer: Self-pay | Admitting: Emergency Medicine

## 2016-06-25 ENCOUNTER — Emergency Department (HOSPITAL_BASED_OUTPATIENT_CLINIC_OR_DEPARTMENT_OTHER): Payer: PRIVATE HEALTH INSURANCE

## 2016-06-25 ENCOUNTER — Emergency Department (HOSPITAL_BASED_OUTPATIENT_CLINIC_OR_DEPARTMENT_OTHER)
Admission: EM | Admit: 2016-06-25 | Discharge: 2016-06-25 | Disposition: A | Discharge status: Home / Self Care | Payer: PRIVATE HEALTH INSURANCE | Attending: Emergency Medicine | Admitting: Emergency Medicine

## 2016-06-25 DIAGNOSIS — J45909 Unspecified asthma, uncomplicated: Secondary | ICD-10-CM | POA: Insufficient documentation

## 2016-06-25 DIAGNOSIS — I10 Essential (primary) hypertension: Secondary | ICD-10-CM | POA: Diagnosis not present

## 2016-06-25 DIAGNOSIS — R109 Unspecified abdominal pain: Secondary | ICD-10-CM | POA: Insufficient documentation

## 2016-06-25 DIAGNOSIS — K529 Noninfective gastroenteritis and colitis, unspecified: Secondary | ICD-10-CM | POA: Insufficient documentation

## 2016-06-25 DIAGNOSIS — F17210 Nicotine dependence, cigarettes, uncomplicated: Secondary | ICD-10-CM | POA: Insufficient documentation

## 2016-06-25 DIAGNOSIS — R1110 Vomiting, unspecified: Secondary | ICD-10-CM | POA: Diagnosis present

## 2016-06-25 LAB — COMPREHENSIVE METABOLIC PANEL
ALBUMIN: 2.8 g/dL — AB (ref 3.5–5.0)
ALT: 7 U/L — AB (ref 14–54)
AST: 9 U/L — AB (ref 15–41)
Alkaline Phosphatase: 71 U/L (ref 38–126)
Anion gap: 8 (ref 5–15)
BUN: 7 mg/dL (ref 6–20)
CHLORIDE: 109 mmol/L (ref 101–111)
CO2: 21 mmol/L — ABNORMAL LOW (ref 22–32)
CREATININE: 0.5 mg/dL (ref 0.44–1.00)
Calcium: 7.6 mg/dL — ABNORMAL LOW (ref 8.9–10.3)
GFR calc Af Amer: >60 mL/min (ref >60)
GFR calc non Af Amer: >60 mL/min (ref >60)
GLUCOSE: 91 mg/dL (ref 65–99)
POTASSIUM: 2.7 mmol/L — AB (ref 3.5–5.1)
Sodium: 138 mmol/L (ref 135–145)
Total Bilirubin: 0.4 mg/dL (ref 0.3–1.2)
Total Protein: 6.1 g/dL — ABNORMAL LOW (ref 6.5–8.1)

## 2016-06-25 LAB — URINALYSIS, ROUTINE W REFLEX MICROSCOPIC
Bilirubin Urine: NEGATIVE
GLUCOSE, UA: NEGATIVE mg/dL
HGB URINE DIPSTICK: NEGATIVE
Ketones, ur: 40 mg/dL — AB
LEUKOCYTES UA: NEGATIVE
Nitrite: NEGATIVE
PROTEIN: NEGATIVE mg/dL
SPECIFIC GRAVITY, URINE: 1.007 (ref 1.005–1.030)
pH: 6.5 (ref 5.0–8.0)

## 2016-06-25 LAB — CBC WITH DIFFERENTIAL/PLATELET
BASOS ABS: 0 10*3/uL (ref 0.0–0.1)
Basophils Relative: 0 %
EOS PCT: 1 %
Eosinophils Absolute: 0.1 10*3/uL (ref 0.0–0.7)
HCT: 36.2 % (ref 36.0–46.0)
HEMOGLOBIN: 12.1 g/dL (ref 12.0–15.0)
LYMPHS ABS: 1.4 10*3/uL (ref 0.7–4.0)
LYMPHS PCT: 11 %
MCH: 26.8 pg (ref 26.0–34.0)
MCHC: 33.4 g/dL (ref 30.0–36.0)
MCV: 80.1 fL (ref 78.0–100.0)
Monocytes Absolute: 1.2 10*3/uL — ABNORMAL HIGH (ref 0.1–1.0)
Monocytes Relative: 10 %
NEUTROS PCT: 78 %
Neutro Abs: 10.3 10*3/uL — ABNORMAL HIGH (ref 1.7–7.7)
PLATELETS: 367 10*3/uL (ref 150–400)
RBC: 4.52 MIL/uL (ref 3.87–5.11)
RDW: 17.1 % — ABNORMAL HIGH (ref 11.5–15.5)
WBC: 13.1 10*3/uL — AB (ref 4.0–10.5)

## 2016-06-25 LAB — PREGNANCY, URINE: Preg Test, Ur: NEGATIVE

## 2016-06-25 LAB — LIPASE, BLOOD: Lipase: 17 U/L (ref 11–51)

## 2016-06-25 MED ORDER — ONDANSETRON 4 MG PO TBDP
4.0000 mg | ORAL_TABLET | ORAL | 0 refills | Status: AC | PRN
Start: 2016-06-25 — End: ?

## 2016-06-25 MED ORDER — HYDROCODONE-ACETAMINOPHEN 5-325 MG PO TABS
2.0000 | ORAL_TABLET | Freq: Once | ORAL | Status: AC
  Administered 2016-06-25: 2 via ORAL
  Filled 2016-06-25: qty 2

## 2016-06-25 MED ORDER — ONDANSETRON HCL 4 MG/2ML IJ SOLN
4.0000 mg | Freq: Once | INTRAMUSCULAR | Status: AC
  Administered 2016-06-25: 4 mg via INTRAVENOUS
  Filled 2016-06-25: qty 2

## 2016-06-25 MED ORDER — POTASSIUM CHLORIDE CRYS ER 20 MEQ PO TBCR: 40.0000 meq | EXTENDED_RELEASE_TABLET | Freq: Every day | ORAL | 0 refills | Status: AC

## 2016-06-25 MED ORDER — CIPROFLOXACIN HCL 500 MG PO TABS: 500.0000 mg | ORAL_TABLET | Freq: Two times a day (BID) | ORAL | 0 refills | Status: AC

## 2016-06-25 MED ORDER — IOPAMIDOL (ISOVUE-300) INJECTION 61%
100.0000 mL | Freq: Once | INTRAVENOUS | Status: AC | PRN
  Administered 2016-06-25: 100 mL via INTRAVENOUS

## 2016-06-25 MED ORDER — HYDROCODONE-ACETAMINOPHEN 5-325 MG PO TABS: 1.0000 | ORAL_TABLET | ORAL | 0 refills | Status: AC | PRN

## 2016-06-25 MED ORDER — POTASSIUM CHLORIDE CRYS ER 20 MEQ PO TBCR
40.0000 meq | EXTENDED_RELEASE_TABLET | Freq: Once | ORAL | Status: AC
  Administered 2016-06-25: 40 meq via ORAL
  Filled 2016-06-25: qty 2

## 2016-06-25 MED ORDER — METRONIDAZOLE 500 MG PO TABS: 500.0000 mg | ORAL_TABLET | Freq: Two times a day (BID) | ORAL | 0 refills | Status: AC

## 2016-06-25 MED ORDER — MORPHINE SULFATE (PF) 4 MG/ML IV SOLN
4.0000 mg | Freq: Once | INTRAVENOUS | Status: AC
Start: 2016-06-25 — End: 2016-06-25
  Administered 2016-06-25: 4 mg via INTRAVENOUS
  Filled 2016-06-25: qty 1

## 2016-06-25 MED ORDER — SODIUM CHLORIDE 0.9 % IV BOLUS (SEPSIS)
1000.0000 mL | Freq: Once | INTRAVENOUS | Status: AC
  Administered 2016-06-25: 1000 mL via INTRAVENOUS

## 2016-06-25 MED FILL — HYDROCODON-APAP 5-325: 5-325 | 2 days supply | Qty: 20 | Fill #0

## 2016-06-25 MED FILL — POTASSIUM CL ER 20 MEQ TABL: 20 | 3 days supply | Qty: 6 | Fill #0

## 2016-06-25 MED FILL — metroNIDAZOLE 500 MG TABS: 500 | 7 days supply | Qty: 14 | Fill #0

## 2016-06-25 MED FILL — CIPROFLOXACIN HCL 500 MG TA: 500 | 7 days supply | Qty: 14 | Fill #0

## 2016-06-25 MED FILL — ONDANSETRON ODT 4 MG TABLET: 4 | 3 days supply | Qty: 20 | Fill #0
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
## 2016-06-25 NOTE — ED Notes (Signed)
Pt given d/c instructions. Rx x 5 with narc and tyl prec. Verbalizes understanding. No questions.

## 2016-06-25 NOTE — ED Provider Notes (Signed)
I have assumed care for Dr. Pecola Leisureeese with plan to review CT findings to make final disposition.  CT shows colitis with some inflammatory change but no free fluid, abscess or signs of obstruction.  Patient reports she had been vomiting a large amount about 3 days ago. She feels that today she is tolerating fluids. She reports she has been on the drink quite a bit of water without having any vomiting episodes. He has tolerated oral medication in the emergency department without vomiting.  Her abdomen is soft with moderate diffuse tenderness but no guarding. Patient is nontoxic.  We discussed possibility of hospital treatment with IV fluids, pain control treatment for colitis and hypokalemia. Patient advised she has a high needs handicapped child at home. She feels that she must be home at this time and that she is tolerating her medications and fluids adequately. She is counseled on the necessity to return immediately if she should develop a fever, suddenly worsening pain recurrence of vomiting or inability to tolerate her oral medications. Patient is aware the necessity for recheck within one to 2 days.   Arby BarretteMarcy Pranay Hilbun, MD 06/25/16 [phone]

## 2016-06-25 NOTE — ED Notes (Addendum)
Pfeiffer MD at bedside 

## 2016-06-25 NOTE — ED Triage Notes (Signed)
Pt states she has been throwing up since Sunday.  States her belly hurts, teeth hurt and bladder hurts.  States she's SOB, pt smokes and smoked before coming in educated about smoking and SOB.

## 2016-06-25 NOTE — ED Notes (Signed)
Pt ambulatory to the restroom without difficulty.

## 2016-06-27 ENCOUNTER — Emergency Department (HOSPITAL_BASED_OUTPATIENT_CLINIC_OR_DEPARTMENT_OTHER)
Admission: EM | Admit: 2016-06-27 | Discharge: 2016-06-27 | Disposition: A | Discharge status: Home / Self Care | Payer: PRIVATE HEALTH INSURANCE | Attending: Emergency Medicine | Admitting: Emergency Medicine

## 2016-06-27 ENCOUNTER — Encounter (HOSPITAL_BASED_OUTPATIENT_CLINIC_OR_DEPARTMENT_OTHER): Payer: Self-pay | Admitting: *Deleted

## 2016-06-27 DIAGNOSIS — R110 Nausea: Secondary | ICD-10-CM | POA: Diagnosis not present

## 2016-06-27 DIAGNOSIS — F17210 Nicotine dependence, cigarettes, uncomplicated: Secondary | ICD-10-CM | POA: Insufficient documentation

## 2016-06-27 DIAGNOSIS — I10 Essential (primary) hypertension: Secondary | ICD-10-CM | POA: Diagnosis not present

## 2016-06-27 DIAGNOSIS — J45909 Unspecified asthma, uncomplicated: Secondary | ICD-10-CM | POA: Insufficient documentation

## 2016-06-27 DIAGNOSIS — R197 Diarrhea, unspecified: Secondary | ICD-10-CM | POA: Insufficient documentation

## 2016-06-27 DIAGNOSIS — R1030 Lower abdominal pain, unspecified: Secondary | ICD-10-CM

## 2016-06-27 LAB — URINE MICROSCOPIC-ADD ON

## 2016-06-27 LAB — COMPREHENSIVE METABOLIC PANEL
ALBUMIN: 3 g/dL — AB (ref 3.5–5.0)
ALK PHOS: 71 U/L (ref 38–126)
ALT: 9 U/L — AB (ref 14–54)
AST: 9 U/L — ABNORMAL LOW (ref 15–41)
Anion gap: 11 (ref 5–15)
BUN: 6 mg/dL (ref 6–20)
CALCIUM: 8.1 mg/dL — AB (ref 8.9–10.3)
CO2: 16 mmol/L — AB (ref 22–32)
CREATININE: 0.6 mg/dL (ref 0.44–1.00)
Chloride: 108 mmol/L (ref 101–111)
GFR calc non Af Amer: >60 mL/min (ref >60)
GLUCOSE: 80 mg/dL (ref 65–99)
Potassium: 3.5 mmol/L (ref 3.5–5.1)
SODIUM: 135 mmol/L (ref 135–145)
Total Bilirubin: 0.6 mg/dL (ref 0.3–1.2)
Total Protein: 6.5 g/dL (ref 6.5–8.1)

## 2016-06-27 LAB — URINALYSIS, ROUTINE W REFLEX MICROSCOPIC
Bilirubin Urine: NEGATIVE
GLUCOSE, UA: NEGATIVE mg/dL
Ketones, ur: >80 mg/dL — AB
Leukocytes, UA: NEGATIVE
Nitrite: NEGATIVE
PROTEIN: NEGATIVE mg/dL
SPECIFIC GRAVITY, URINE: 1.013 (ref 1.005–1.030)
pH: 6 (ref 5.0–8.0)

## 2016-06-27 LAB — CBC WITH DIFFERENTIAL/PLATELET
Basophils Absolute: 0 10*3/uL (ref 0.0–0.1)
Basophils Relative: 1 %
EOS ABS: 0.1 10*3/uL (ref 0.0–0.7)
EOS PCT: 1 %
HCT: 35.1 % — ABNORMAL LOW (ref 36.0–46.0)
Hemoglobin: 11.7 g/dL — ABNORMAL LOW (ref 12.0–15.0)
LYMPHS ABS: 1.1 10*3/uL (ref 0.7–4.0)
Lymphocytes Relative: 15 %
MCH: 26.9 pg (ref 26.0–34.0)
MCHC: 33.3 g/dL (ref 30.0–36.0)
MCV: 80.7 fL (ref 78.0–100.0)
MONO ABS: 0.8 10*3/uL (ref 0.1–1.0)
MONOS PCT: 10 %
Neutro Abs: 5.8 10*3/uL (ref 1.7–7.7)
Neutrophils Relative %: 73 %
PLATELETS: 422 10*3/uL — AB (ref 150–400)
RBC: 4.35 MIL/uL (ref 3.87–5.11)
RDW: 16.8 % — ABNORMAL HIGH (ref 11.5–15.5)
WBC: 7.8 10*3/uL (ref 4.0–10.5)

## 2016-06-27 LAB — LIPASE, BLOOD: Lipase: 13 U/L (ref 11–51)

## 2016-06-27 MED ORDER — OXYCODONE-ACETAMINOPHEN 5-325 MG PO TABS: 2.0000 | ORAL_TABLET | ORAL | 0 refills | Status: AC | PRN

## 2016-06-27 MED ORDER — OXYCODONE-ACETAMINOPHEN 5-325 MG PO TABS
1.0000 | ORAL_TABLET | Freq: Once | ORAL | Status: AC
  Administered 2016-06-27: 1 via ORAL
  Filled 2016-06-27: qty 1

## 2016-06-27 MED ORDER — DICYCLOMINE HCL 10 MG PO CAPS
10.0000 mg | ORAL_CAPSULE | Freq: Once | ORAL | Status: AC
  Administered 2016-06-27: 10 mg via ORAL
  Filled 2016-06-27: qty 1

## 2016-06-27 MED ORDER — ONDANSETRON HCL 4 MG PO TABS: 4.0000 mg | ORAL_TABLET | Freq: Four times a day (QID) | ORAL | 0 refills | Status: AC

## 2016-06-27 MED ORDER — DICYCLOMINE HCL 20 MG PO TABS: 20.0000 mg | ORAL_TABLET | Freq: Two times a day (BID) | ORAL | 0 refills | Status: AC

## 2016-06-27 MED FILL — OXYCODONE/APAP 5-325: 5-325 | 2 days supply | Qty: 15 | Fill #0

## 2016-06-27 MED FILL — DICYCLOMINE 20 MG TABLET: 20 | 10 days supply | Qty: 20 | Fill #0

## 2016-06-27 NOTE — ED Notes (Signed)
She is drinking water on arrival with no vomiting.

## 2016-06-27 NOTE — ED Triage Notes (Signed)
States she was seen 2 days ago for abdominal pain. States she cant manage her pain or sleep.
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
## 2016-06-27 NOTE — ED Notes (Signed)
Pt able to ambulate to restroom with no assistance.  

## 2016-06-27 NOTE — Discharge Instructions (Signed)
You have been seen in the Emergency Department (ED) for abdominal pain.  Your evaluation did not identify a clear cause of your symptoms but we suspect resolving colitis. Your labs were generally reassuring.  Please follow up as instructed above regarding today?s emergent visit and the symptoms that are bothering you.  Return to the ED if your abdominal pain worsens or fails to improve, you develop bloody vomiting, bloody diarrhea, you are unable to tolerate fluids due to vomiting, fever greater than 101, or other symptoms that concern you.

## 2017-04-19 ENCOUNTER — Emergency Department (HOSPITAL_BASED_OUTPATIENT_CLINIC_OR_DEPARTMENT_OTHER): Payer: PRIVATE HEALTH INSURANCE

## 2017-04-19 ENCOUNTER — Encounter (HOSPITAL_BASED_OUTPATIENT_CLINIC_OR_DEPARTMENT_OTHER): Payer: Self-pay

## 2017-04-19 ENCOUNTER — Emergency Department (HOSPITAL_BASED_OUTPATIENT_CLINIC_OR_DEPARTMENT_OTHER)
Admission: EM | Admit: 2017-04-19 | Discharge: 2017-04-19 | Disposition: A | Discharge status: Home / Self Care | Payer: PRIVATE HEALTH INSURANCE | Attending: Emergency Medicine | Admitting: Emergency Medicine

## 2017-04-19 DIAGNOSIS — J189 Pneumonia, unspecified organism: Secondary | ICD-10-CM | POA: Insufficient documentation

## 2017-04-19 DIAGNOSIS — R0602 Shortness of breath: Secondary | ICD-10-CM | POA: Diagnosis not present

## 2017-04-19 DIAGNOSIS — R0789 Other chest pain: Secondary | ICD-10-CM | POA: Insufficient documentation

## 2017-04-19 DIAGNOSIS — Z79899 Other long term (current) drug therapy: Secondary | ICD-10-CM | POA: Insufficient documentation

## 2017-04-19 DIAGNOSIS — R0781 Pleurodynia: Secondary | ICD-10-CM

## 2017-04-19 DIAGNOSIS — M25512 Pain in left shoulder: Secondary | ICD-10-CM | POA: Diagnosis present

## 2017-04-19 DIAGNOSIS — I10 Essential (primary) hypertension: Secondary | ICD-10-CM | POA: Insufficient documentation

## 2017-04-19 DIAGNOSIS — J45909 Unspecified asthma, uncomplicated: Secondary | ICD-10-CM | POA: Insufficient documentation

## 2017-04-19 DIAGNOSIS — R2242 Localized swelling, mass and lump, left lower limb: Secondary | ICD-10-CM

## 2017-04-19 DIAGNOSIS — M79661 Pain in right lower leg: Secondary | ICD-10-CM

## 2017-04-19 DIAGNOSIS — R079 Chest pain, unspecified: Secondary | ICD-10-CM

## 2017-04-19 DIAGNOSIS — F17210 Nicotine dependence, cigarettes, uncomplicated: Secondary | ICD-10-CM | POA: Insufficient documentation

## 2017-04-19 LAB — PROTIME-INR
INR: 0.96
PROTHROMBIN TIME: 12.8 s (ref 11.4–15.2)

## 2017-04-19 LAB — COMPREHENSIVE METABOLIC PANEL
ALT: 9 U/L — AB (ref 14–54)
AST: 13 U/L — ABNORMAL LOW (ref 15–41)
Albumin: 3.3 g/dL — ABNORMAL LOW (ref 3.5–5.0)
Alkaline Phosphatase: 84 U/L (ref 38–126)
Anion gap: 10 (ref 5–15)
BILIRUBIN TOTAL: 0.3 mg/dL (ref 0.3–1.2)
BUN: 14 mg/dL (ref 6–20)
CALCIUM: 8.3 mg/dL — AB (ref 8.9–10.3)
CHLORIDE: 107 mmol/L (ref 101–111)
CO2: 20 mmol/L — ABNORMAL LOW (ref 22–32)
CREATININE: 0.48 mg/dL (ref 0.44–1.00)
Glucose, Bld: 82 mg/dL (ref 65–99)
Potassium: 3 mmol/L — ABNORMAL LOW (ref 3.5–5.1)
Sodium: 137 mmol/L (ref 135–145)
TOTAL PROTEIN: 6.9 g/dL (ref 6.5–8.1)

## 2017-04-19 LAB — CBC WITH DIFFERENTIAL/PLATELET
Basophils Absolute: 0.1 10*3/uL (ref 0.0–0.1)
Basophils Relative: 0 %
EOS PCT: 2 %
Eosinophils Absolute: 0.3 10*3/uL (ref 0.0–0.7)
HEMATOCRIT: 34 % — AB (ref 36.0–46.0)
Hemoglobin: 11.3 g/dL — ABNORMAL LOW (ref 12.0–15.0)
LYMPHS ABS: 2.9 10*3/uL (ref 0.7–4.0)
LYMPHS PCT: 25 %
MCH: 27.5 pg (ref 26.0–34.0)
MCHC: 33.2 g/dL (ref 30.0–36.0)
MCV: 82.7 fL (ref 78.0–100.0)
MONO ABS: 0.7 10*3/uL (ref 0.1–1.0)
Monocytes Relative: 6 %
NEUTROS ABS: 7.8 10*3/uL — AB (ref 1.7–7.7)
Neutrophils Relative %: 67 %
PLATELETS: 497 10*3/uL — AB (ref 150–400)
RBC: 4.11 MIL/uL (ref 3.87–5.11)
RDW: 16.6 % — ABNORMAL HIGH (ref 11.5–15.5)
WBC: 11.8 10*3/uL — AB (ref 4.0–10.5)

## 2017-04-19 LAB — LIPASE, BLOOD: LIPASE: 102 U/L — AB (ref 11–51)

## 2017-04-19 LAB — TROPONIN I
TROPONIN I: 0.03 ng/mL — AB (ref <0.03)
Troponin I: 0.03 ng/mL (ref <0.03)

## 2017-04-19 MED ORDER — LEVOFLOXACIN 750 MG PO TABS
750.0000 mg | ORAL_TABLET | Freq: Once | ORAL | Status: AC
  Administered 2017-04-19: 750 mg via ORAL
  Filled 2017-04-19: qty 1

## 2017-04-19 MED ORDER — IOPAMIDOL (ISOVUE-370) INJECTION 76%
100.0000 mL | Freq: Once | INTRAVENOUS | Status: AC | PRN
  Administered 2017-04-19: 100 mL via INTRAVENOUS

## 2017-04-19 MED ORDER — LEVOFLOXACIN 750 MG PO TABS: 750.0000 mg | ORAL_TABLET | Freq: Every day | ORAL | 0 refills | Status: AC

## 2017-04-19 MED ORDER — FENTANYL CITRATE (PF) 100 MCG/2ML IJ SOLN
50.0000 ug | Freq: Once | INTRAMUSCULAR | Status: AC
  Administered 2017-04-19: 50 ug via INTRAVENOUS
  Filled 2017-04-19: qty 2

## 2017-04-19 MED ORDER — ONDANSETRON HCL 4 MG/2ML IJ SOLN
4.0000 mg | Freq: Once | INTRAMUSCULAR | Status: AC
  Administered 2017-04-19: 4 mg via INTRAVENOUS
  Filled 2017-04-19: qty 2

## 2017-04-19 MED ORDER — MORPHINE SULFATE (PF) 4 MG/ML IV SOLN
4.0000 mg | Freq: Once | INTRAVENOUS | Status: AC
  Administered 2017-04-19: 4 mg via INTRAVENOUS
  Filled 2017-04-19: qty 1

## 2017-04-19 MED ORDER — POTASSIUM CHLORIDE CRYS ER 20 MEQ PO TBCR
40.0000 meq | EXTENDED_RELEASE_TABLET | Freq: Once | ORAL | Status: AC
  Administered 2017-04-19: 40 meq via ORAL
  Filled 2017-04-19: qty 2

## 2017-04-19 MED ORDER — OXYCODONE-ACETAMINOPHEN 5-325 MG PO TABS: 1.0000 | ORAL_TABLET | ORAL | 0 refills | Status: AC | PRN

## 2017-04-19 NOTE — ED Notes (Signed)
US in progress at bedside.

## 2017-04-19 NOTE — ED Notes (Signed)
Troponin 0.03. Dr. Rush Landmarkegeler aware and primary nurse aware. No orders received.

## 2017-04-19 NOTE — ED Notes (Signed)
Patient transported to CT 
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
## 2017-04-19 NOTE — ED Notes (Signed)
RN aware of BP 

## 2017-04-19 NOTE — ED Notes (Signed)
Pt husband yelling and slamming door. Upset about wait time. EDP notified.

## 2017-04-19 NOTE — ED Triage Notes (Addendum)
PT reports left ribcage pain since Friday, difficulty taking a deep breath, also has left shoulder pain, denies injury. 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

## 2017-04-19 NOTE — Discharge Instructions (Signed)
Please take your antibiotics to treat your pneumonia. Please use the pain medicine to help with her discomfort. Please stay hydrated. Please schedule a follow-up appointment with your PCP for reevaluation in the next several days. If any symptoms change or worsen, please return to the nearest emergency department.

## 2017-08-17 IMAGING — CT CT ANGIO CHEST
2 of 8 series · 18 of 36 positions shown · IV contrast (isovue)
Comparison: Chest radiograph 08/25/2014

CLINICAL DATA: Patient with left lower chest pain and left shoulder
pain, worse with inspiration.

EXAM:
CT ANGIOGRAPHY CHEST WITH CONTRAST
TECHNIQUE: Multidetector CT imaging of the chest was performed using the
standard protocol during bolus administration of intravenous
contrast. Multiplanar CT image reconstructions and MIPs were
obtained to evaluate the vascular anatomy.
CONTRAST:  65 cc Isovue 370

[Series 6: pe thins · axial · 0.70mm/px · z∈[-253,-6]mm · 17 of 277 slices shown]
[im 15/277  lung]
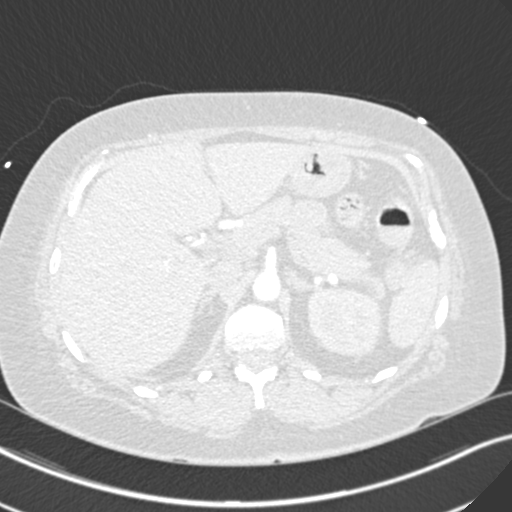
[im 30/277  mediastinal]
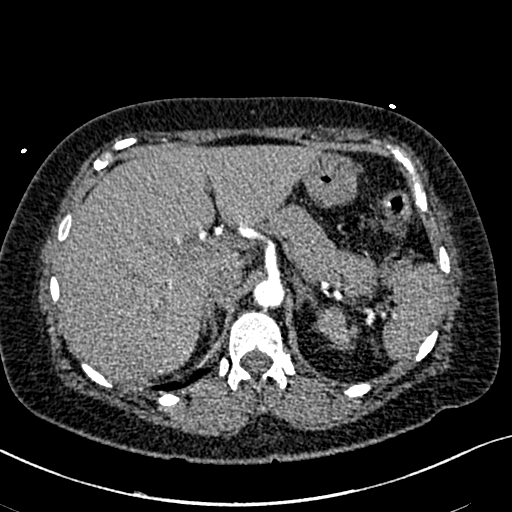
[im 44/277  lung]
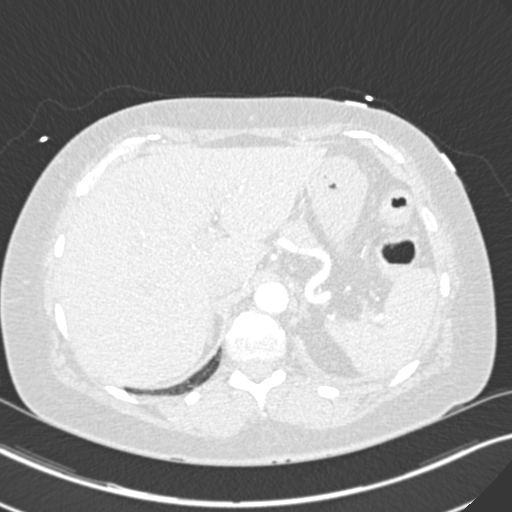
[im 59/277  mediastinal]
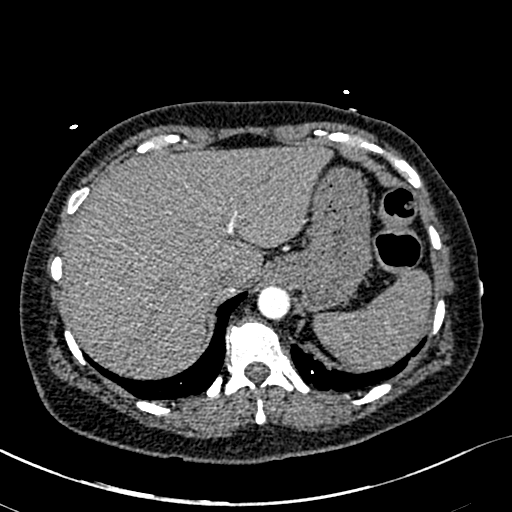
[im 73/277  lung]
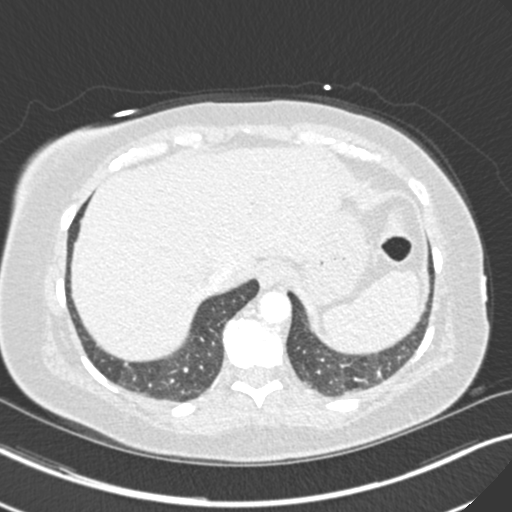
[im 88/277  mediastinal]
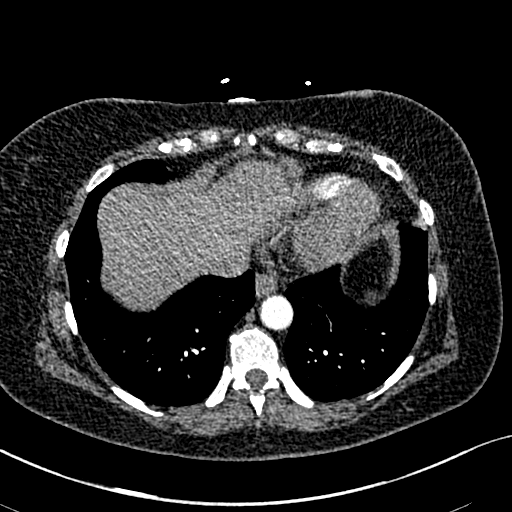
[im 102/277  lung]
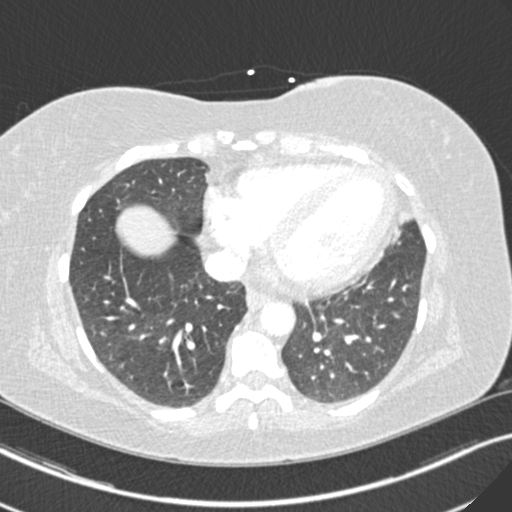
[im 117/277  mediastinal]
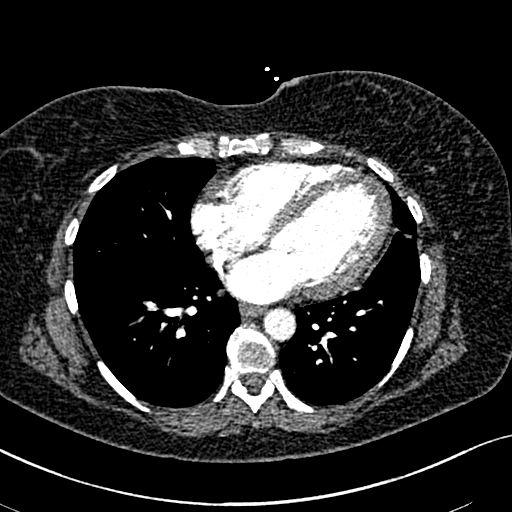
[im 146/277  lung]
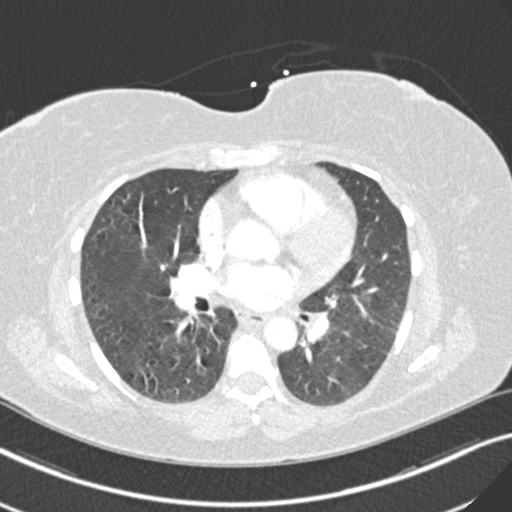
[im 160/277  mediastinal]
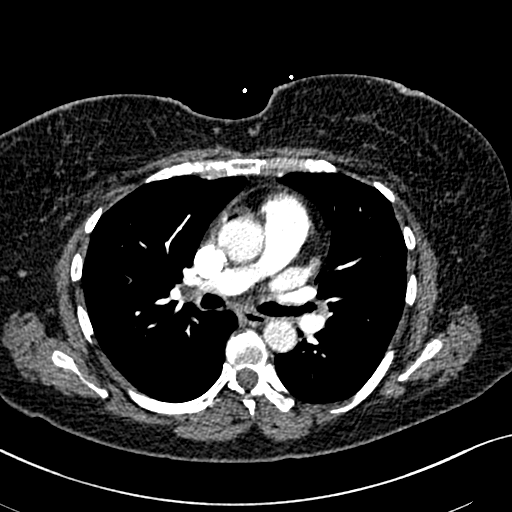
[im 175/277  lung]
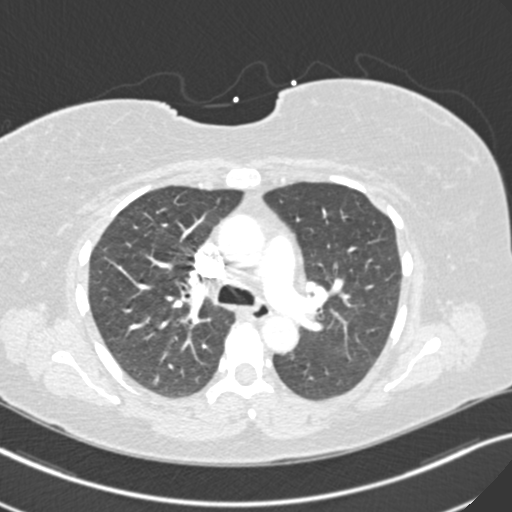
[im 189/277  mediastinal]
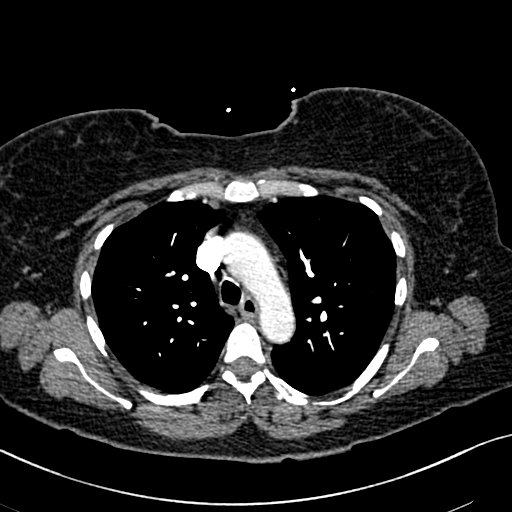
[im 204/277  lung]
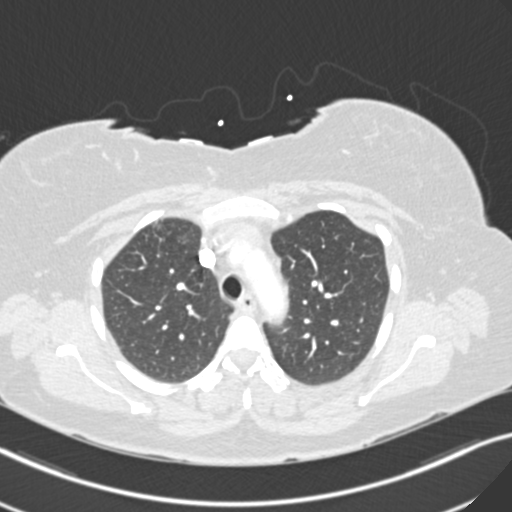
[im 218/277  mediastinal]
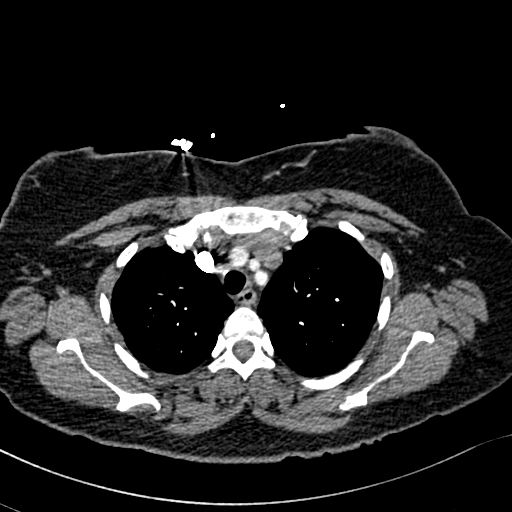
[im 233/277  lung]
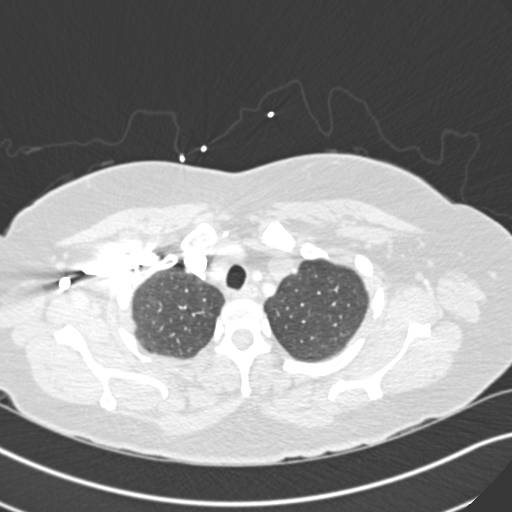
[im 247/277  mediastinal]
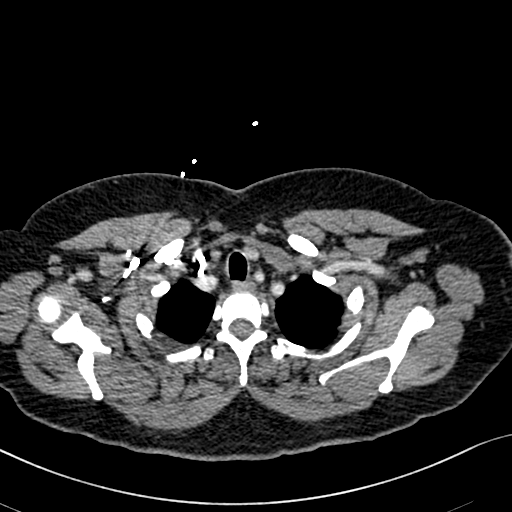
[im 262/277  lung]
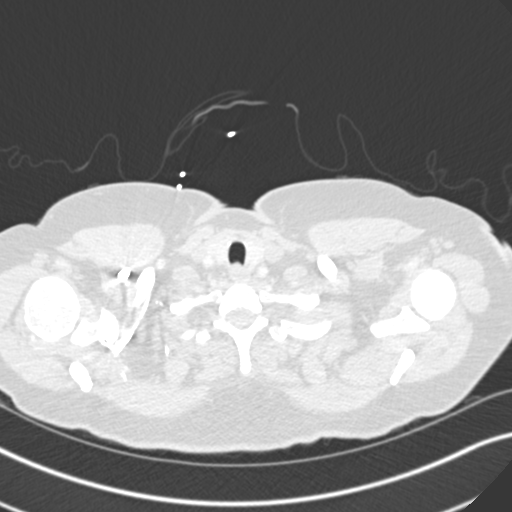

[Series 7: pe coronal mpr · coronal · 0.55mm/px · 1 of 124 slices shown]
[im 62/124  mediastinal]
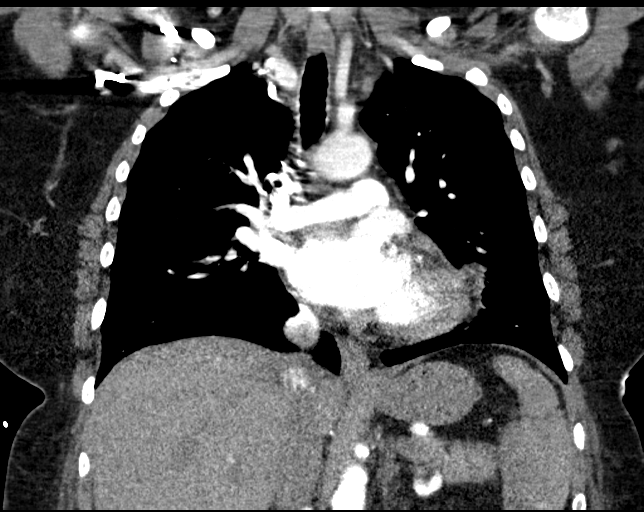

[18 of 36 positions shown; findings below may reference images not displayed]

FINDINGS: Cardiovascular: Heart is normal in size. No pericardial effusion.
Aorta and main pulmonary artery are normal in caliber. Adequate
opacification the pulmonary arterial system. No filling defect
identified to suggest acute pulmonary embolus.

Mediastinum/Nodes: No enlarged axillary, mediastinal or hilar
lymphadenopathy. Esophagus is normal in appearance.

Lungs/Pleura: Central airways are patent. Dependent atelectasis
within the bilateral lower lobes. In the lingula there is a 10 x 12
mm nodule (11 mm mean diameter) on image 42 of series 5.
Additionally within the lingula there is a more focal 1.8 cm area of
consolidative opacity. Two adjacent subpleural 4 mm nodules
demonstrated within the left lower lobe (image 169; series 6). 4 mm
right upper lobe pulmonary nodule (image 103; series 6). No pleural
effusion or pneumothorax.

Upper Abdomen: No acute process.

Musculoskeletal: Thoracic spine degenerative changes. No aggressive
or acute appearing osseous lesions.

Review of the MIP images confirms the above findings.
IMPRESSION: No evidence for acute pulmonary embolus.

Irregular patchy consolidation within the lingula as well as a more
peripherally located nodular opacity within the lingula. Findings
are nonspecific however may represent infection in the appropriate
clinical setting. Recommend follow-up chest CT in 2- 3 months to
assess for interval resolution.

Additional smaller bilateral pulmonary nodules as described above.
Recommend attention on followup.

## 2017-12-15 ENCOUNTER — Other Ambulatory Visit: Payer: Self-pay

## 2017-12-15 ENCOUNTER — Encounter (HOSPITAL_BASED_OUTPATIENT_CLINIC_OR_DEPARTMENT_OTHER): Payer: Self-pay | Admitting: *Deleted

## 2017-12-15 ENCOUNTER — Emergency Department (HOSPITAL_BASED_OUTPATIENT_CLINIC_OR_DEPARTMENT_OTHER)
Admission: EM | Admit: 2017-12-15 | Discharge: 2017-12-15 | Disposition: A | Discharge status: Home / Self Care | Payer: PRIVATE HEALTH INSURANCE | Attending: Emergency Medicine | Admitting: Emergency Medicine

## 2017-12-15 DIAGNOSIS — R51 Headache: Secondary | ICD-10-CM | POA: Insufficient documentation

## 2017-12-15 DIAGNOSIS — Z5321 Procedure and treatment not carried out due to patient leaving prior to being seen by health care provider: Secondary | ICD-10-CM | POA: Insufficient documentation

## 2017-12-15 DIAGNOSIS — I10 Essential (primary) hypertension: Secondary | ICD-10-CM | POA: Insufficient documentation

## 2017-12-15 DIAGNOSIS — R1110 Vomiting, unspecified: Secondary | ICD-10-CM | POA: Insufficient documentation

## 2017-12-15 NOTE — ED Triage Notes (Signed)
Headache x 4 days. Hx of HTN but she no longer has a primary. Has not had her HTN treated in over 10 years.

## 2017-12-15 NOTE — ED Notes (Signed)
Pt states she has not been compliant with HTN medication and it has been 2-3 years since she has taken any medication.

## 2017-12-15 NOTE — ED Notes (Signed)
The unit secretary stated that the patient stated on the intercom that she was leaving. Upon checking in with the patient, she was not in the room.
